# Patient Record
Sex: Male | Born: 1955
Health system: Southern US, Community
[De-identification: ages and names within clinical notes are randomized; demographics above are authoritative.]

## PROBLEM LIST (undated history)

## (undated) DIAGNOSIS — B182 Chronic viral hepatitis C: Secondary | ICD-10-CM

## (undated) DIAGNOSIS — F1910 Other psychoactive substance abuse, uncomplicated: Secondary | ICD-10-CM

## (undated) DIAGNOSIS — E119 Type 2 diabetes mellitus without complications: Secondary | ICD-10-CM

## (undated) DIAGNOSIS — I10 Essential (primary) hypertension: Secondary | ICD-10-CM

## (undated) DIAGNOSIS — F1120 Opioid dependence, uncomplicated: Secondary | ICD-10-CM

## (undated) HISTORY — PX: COLONOSCOPY: SHX174

---

## 2005-02-03 ENCOUNTER — Ambulatory Visit: Payer: Self-pay | Admitting: Family Medicine

## 2005-02-16 ENCOUNTER — Ambulatory Visit: Payer: Self-pay | Admitting: Family Medicine

## 2005-02-18 ENCOUNTER — Ambulatory Visit: Payer: Self-pay | Admitting: *Deleted

## 2005-03-10 ENCOUNTER — Ambulatory Visit: Payer: Self-pay | Admitting: Family Medicine

## 2005-06-07 ENCOUNTER — Ambulatory Visit: Payer: Self-pay | Admitting: Family Medicine

## 2005-06-19 ENCOUNTER — Emergency Department (HOSPITAL_COMMUNITY): Admission: EM | Admit: 2005-06-19 | Discharge: 2005-06-19 | Payer: Self-pay | Admitting: Emergency Medicine

## 2005-06-22 ENCOUNTER — Ambulatory Visit: Payer: Self-pay | Admitting: Family Medicine

## 2005-07-26 ENCOUNTER — Ambulatory Visit: Payer: Self-pay | Admitting: Gastroenterology

## 2005-09-16 ENCOUNTER — Ambulatory Visit (HOSPITAL_COMMUNITY): Admission: RE | Admit: 2005-09-16 | Discharge: 2005-09-16 | Payer: Self-pay | Admitting: Gastroenterology

## 2005-09-16 ENCOUNTER — Encounter (INDEPENDENT_AMBULATORY_CARE_PROVIDER_SITE_OTHER): Payer: Self-pay | Admitting: Specialist

## 2005-11-07 ENCOUNTER — Ambulatory Visit: Payer: Self-pay | Admitting: Family Medicine

## 2006-03-21 ENCOUNTER — Ambulatory Visit: Payer: Self-pay | Admitting: Gastroenterology

## 2006-04-25 ENCOUNTER — Ambulatory Visit: Payer: Self-pay | Admitting: Gastroenterology

## 2006-06-26 ENCOUNTER — Ambulatory Visit: Payer: Self-pay | Admitting: Family Medicine

## 2006-08-30 ENCOUNTER — Ambulatory Visit: Payer: Self-pay | Admitting: Gastroenterology

## 2006-09-06 ENCOUNTER — Ambulatory Visit: Payer: Self-pay | Admitting: Gastroenterology

## 2006-09-25 ENCOUNTER — Ambulatory Visit: Payer: Self-pay | Admitting: Gastroenterology

## 2006-11-08 ENCOUNTER — Encounter (INDEPENDENT_AMBULATORY_CARE_PROVIDER_SITE_OTHER): Payer: Self-pay | Admitting: *Deleted

## 2007-03-15 ENCOUNTER — Ambulatory Visit: Payer: Self-pay | Admitting: Gastroenterology

## 2007-04-20 ENCOUNTER — Ambulatory Visit: Payer: Self-pay | Admitting: Internal Medicine

## 2007-04-20 ENCOUNTER — Encounter (INDEPENDENT_AMBULATORY_CARE_PROVIDER_SITE_OTHER): Payer: Self-pay | Admitting: Family Medicine

## 2007-04-20 LAB — CONVERTED CEMR LAB
ALT: 22 units/L (ref 0–53)
AST: 25 units/L (ref 0–37)
Albumin: 4.1 g/dL (ref 3.5–5.2)
CO2: 21 meq/L (ref 19–32)
Chloride: 104 meq/L (ref 96–112)
Creatinine, Ser: 0.84 mg/dL (ref 0.40–1.50)
Glucose, Bld: 90 mg/dL (ref 70–99)
HDL: 33 mg/dL — ABNORMAL LOW (ref >39)
LDL Cholesterol: 108 mg/dL — ABNORMAL HIGH (ref 0–99)
Lymphs Abs: 2.5 10*3/uL (ref 0.7–4.0)
MCHC: 32.9 g/dL (ref 30.0–36.0)
Monocytes Relative: 10 % (ref 3–12)
Neutro Abs: 2.1 10*3/uL (ref 1.7–7.7)
Neutrophils Relative %: 41 % — ABNORMAL LOW (ref 43–77)
Potassium: 4.3 meq/L (ref 3.5–5.3)
RDW: 13.6 % (ref 11.5–15.5)
Sodium: 137 meq/L (ref 135–145)
Total CHOL/HDL Ratio: 5.6
Total Protein: 8.3 g/dL (ref 6.0–8.3)
Triglycerides: 219 mg/dL — ABNORMAL HIGH (ref <150)
VLDL: 44 mg/dL — ABNORMAL HIGH (ref 0–40)
WBC: 5.3 10*3/uL (ref 4.0–10.5)

## 2007-09-17 ENCOUNTER — Ambulatory Visit: Payer: Self-pay | Admitting: Internal Medicine

## 2008-08-07 ENCOUNTER — Ambulatory Visit: Payer: Self-pay | Admitting: Internal Medicine

## 2010-07-09 NOTE — Consult Note (Signed)
NAME:  Adrian Castillo, Adrian Castillo                 ACCOUNT NO.:  [id number]   MEDICAL RECORD NO.:  [id number]          PATIENT TYPE:  EMS   LOCATION:  [facility]                         FACILITY:  [facility]   PHYSICIAN:  Ollen Gross. Vernell Morgans, M.D. DATE OF BIRTH:  May 02, 1955   DATE OF CONSULTATION:  06/19/2005  DATE OF DISCHARGE:  06/19/2005                                   CONSULTATION   HISTORY:  Mr. Schwinn is a 55 year old black male who has a long history of  hemorrhoids but began having acute pain on Friday.  Pain did not improve  during the weekend.  He has had a little bit of bleeding at his rectum, as  well.  He otherwise denies any fevers or chills, chest pain, shortness of  breath, diarrhea, or dysuria.  The rest of his review of systems,  unremarkable.   PAST MEDICAL HISTORY:  Significant for hepatitis B, hypertension,  hemorrhoids.   PAST SURGICAL HISTORY:  None.   MEDICATIONS:  Sular.   ALLERGIES:  TYLENOL.   SOCIAL HISTORY:  He is a heavy drinker and smokes cigarettes.   FAMILY HISTORY:  Noncontributory.   PHYSICAL EXAMINATION:  VITAL SIGNS:  Temp 97.9, blood pressure 143/103,  pulse 99.  GENERAL:  He is a well-developed, well-nourished black male in no acute  distress.  SKIN:  Warm and dry without jaundice.  HEENT:  Eyes:  Extraocular muscles were intact.  Pupils were equal, round  and reactive to light.  Sclerae nonicteric.  LUNGS:  Clear bilaterally without any use of accessory muscles.  HEART:  Regular rate and rhythm, on plane chest.  ABDOMEN:  Soft, nontender.  No palpable mass or hepatosplenomegaly.  EXTREMITIES:  No clubbing, cyanosis or edema.  Good strength in his arms and  legs.  PSYCH:  He is alert and oriented x 3 with no evidence of anxiety or  depression.  RECTAL:  He has circumferential internal and external hemorrhoids with  prolapse and inflammation.   ASSESSMENT AND PLAN:  This is a 55 year old black male with multiple medical  problems who also has circumferential  inflamed, prolapsing hemorrhoids.  Given the circumferential nature of the hemorrhoids, there is significant  risk of operating on him acutely of causing a rectal stricture.  Because of  this, I think he would be best served by several days of bed rest with local  wound care measures such as ice packs and Tucks pads alternating during the  day and night to try to get these to shrink.  Once they have shrunk, then  will be able to see what  hemorrhoidal tissue is left behind and whether he will need a more formal  hemorrhoidectomy or not.  I have instructed him on this and will plan to see  him back later this week to check on his progress.  He agrees to call sooner  if any problems in the meantime.      Ollen Gross. Vernell Morgans, M.D.  Electronically Signed     PST/MEDQ  D:  06/19/2005  T:  06/20/2005  Job:  [id number]

## 2013-02-04 ENCOUNTER — Encounter (HOSPITAL_COMMUNITY): Payer: Self-pay | Admitting: Emergency Medicine

## 2013-02-04 ENCOUNTER — Emergency Department (HOSPITAL_COMMUNITY)
Admission: EM | Admit: 2013-02-04 | Discharge: 2013-02-04 | Disposition: A | Discharge status: Home / Self Care | Payer: Self-pay | Attending: Emergency Medicine | Admitting: Emergency Medicine

## 2013-02-04 ENCOUNTER — Emergency Department (HOSPITAL_COMMUNITY): Payer: Self-pay

## 2013-02-04 DIAGNOSIS — I10 Essential (primary) hypertension: Secondary | ICD-10-CM | POA: Insufficient documentation

## 2013-02-04 DIAGNOSIS — E1169 Type 2 diabetes mellitus with other specified complication: Secondary | ICD-10-CM | POA: Insufficient documentation

## 2013-02-04 DIAGNOSIS — E876 Hypokalemia: Secondary | ICD-10-CM | POA: Insufficient documentation

## 2013-02-04 DIAGNOSIS — F17200 Nicotine dependence, unspecified, uncomplicated: Secondary | ICD-10-CM | POA: Insufficient documentation

## 2013-02-04 DIAGNOSIS — B182 Chronic viral hepatitis C: Secondary | ICD-10-CM | POA: Insufficient documentation

## 2013-02-04 DIAGNOSIS — Z79899 Other long term (current) drug therapy: Secondary | ICD-10-CM | POA: Insufficient documentation

## 2013-02-04 HISTORY — DX: Essential (primary) hypertension: I10

## 2013-02-04 HISTORY — DX: Type 2 diabetes mellitus without complications: E11.9

## 2013-02-04 HISTORY — DX: Chronic viral hepatitis C: B18.2

## 2013-02-04 LAB — GLUCOSE, CAPILLARY: Glucose-Capillary: 119 mg/dL — ABNORMAL HIGH (ref 70–99)

## 2013-02-04 LAB — CBC
Hemoglobin: 17.6 g/dL — ABNORMAL HIGH (ref 13.0–17.0)
MCHC: 35.6 g/dL (ref 30.0–36.0)
RBC: 5.78 MIL/uL (ref 4.22–5.81)
WBC: 5.3 10*3/uL (ref 4.0–10.5)

## 2013-02-04 LAB — COMPREHENSIVE METABOLIC PANEL
CO2: 25 mEq/L (ref 19–32)
GFR calc non Af Amer: >90 mL/min (ref >90)
Potassium: 3.3 mEq/L — ABNORMAL LOW (ref 3.5–5.1)

## 2013-02-04 LAB — POCT I-STAT TROPONIN I

## 2013-02-04 MED ORDER — POTASSIUM CHLORIDE ER 20 MEQ PO TBCR: 10.0000 meq | EXTENDED_RELEASE_TABLET | Freq: Every day | ORAL | Status: DC

## 2013-02-04 NOTE — ED Notes (Signed)
Patient transported to X-ray 

## 2013-02-04 NOTE — ED Notes (Signed)
Pt c/o low bld sugar readings the past 2 days, along with generalized weakness. sts yesterday he did have some n/v/d, sts that has all resolved now. Denies nausea also. Hx of smoking and HTN. sts he was having some right sided CP earlier, none at this moment. C/o dry cough on and off. Nad, skin warm and dry, resp e/u.

## 2013-02-04 NOTE — ED Notes (Signed)
States that his glucose  Was low x 2 today and yesterday he was really tired states sugar read 50 on jis meter and it is 100 here

## 2013-02-04 NOTE — ED Notes (Signed)
Patient transported back from xray 

## 2013-02-04 NOTE — ED Provider Notes (Signed)
CSN: [id number]     Arrival date & time 02/04/13  1406 History   First MD Initiated Contact with Patient 02/04/13 1828     Chief Complaint  Patient presents with  . Fatigue    HPI Patient presents with 2 episodes of sugars in the 50s yesterday.  Says he's had some feeling of fatigue.  No LOC.  No diaphoresis.  Patient is currently taking Glucotrol and a Dyazide diuretic for diabetes.  Patient has not taken insulin.  Patient's had no episodes of hypoglycemic coma. Past Medical History  Diagnosis Date  . Diabetes mellitus without complication   . Hypertension   . Hep C w/o coma, chronic    History reviewed. No pertinent past surgical history. No family history on file. History  Substance Use Topics  . Smoking status: Current Every Day Smoker  . Smokeless tobacco: Not on file  . Alcohol Use: Yes    Review of Systems All other systems reviewed and are negative Allergies  Review of patient's allergies indicates no known allergies.  Home Medications   Current Outpatient Rx  Name  Route  Sig  Dispense  Refill  . amLODipine (NORVASC) 10 MG tablet   Oral   Take 10 mg by mouth daily.         Marland Kitchen glipiZIDE (GLUCOTROL) 5 MG tablet   Oral   Take 5 mg by mouth daily before breakfast.         . hydrochlorothiazide (HYDRODIURIL) 25 MG tablet   Oral   Take 25 mg by mouth daily.         . hydrocortisone (ANUSOL-HC) 2.5 % rectal cream   Rectal   Place 1 application rectally 2 (two) times daily as needed for hemorrhoids or itching.         . potassium chloride 20 MEQ TBCR   Oral   Take 10 mEq by mouth daily.   15 tablet   0    BP 117/80  Pulse 56  Temp(Src) 98.8 F (37.1 C) (Oral)  Resp 16  Wt 196 lb 4.8 oz (89.041 kg)  SpO2 94% Physical Exam  Nursing note and vitals reviewed. Constitutional: He is oriented to person, place, and time. He appears well-developed and well-nourished. No distress.  HENT:  Head: Normocephalic and atraumatic.  Eyes: Pupils are equal,  round, and reactive to light.  Neck: Normal range of motion.  Cardiovascular: Normal rate and intact distal pulses.   Pulmonary/Chest: No respiratory distress.  Abdominal: Normal appearance. He exhibits no distension.  Musculoskeletal: Normal range of motion.  Neurological: He is alert and oriented to person, place, and time. No cranial nerve deficit.  Skin: Skin is warm and dry. No rash noted.  Psychiatric: He has a normal mood and affect. His behavior is normal.    ED Course  Procedures (including critical care time) Labs Review Labs Reviewed  GLUCOSE, CAPILLARY - Abnormal; Notable for the following:    Glucose-Capillary 119 (*)    All other components within normal limits  CBC - Abnormal; Notable for the following:    Hemoglobin 17.6 (*)    Platelets 138 (*)    All other components within normal limits  COMPREHENSIVE METABOLIC PANEL - Abnormal; Notable for the following:    Potassium 3.3 (*)    Glucose, Bld 118 (*)    Total Protein 8.4 (*)    All other components within normal limits  POCT I-STAT TROPONIN I   Imaging Review No results found.  EKG Interpretation  Date/Time:  Monday February 04 2013 18:39:38 EST Ventricular Rate:  56 PR Interval:  183 QRS Duration: 97 QT Interval:  424 QTC Calculation: 409 R Axis:   29 Text Interpretation:  Confirmed by Phillips Goulette  MD, Leinani Lisbon ([id number]) on 02/04/2013 8:16:00 PM            MDM   1. Hypokalemia        Nelia Shi, MD 02/07/13 [id number]

## 2013-02-04 NOTE — ED Notes (Signed)
CBG is 119. Notified Nurse Arline Asp.

## 2014-07-29 ENCOUNTER — Emergency Department (HOSPITAL_COMMUNITY)
Admission: EM | Admit: 2014-07-29 | Discharge: 2014-07-29 | Disposition: A | Discharge status: Home / Self Care | Payer: No Typology Code available for payment source | Attending: Emergency Medicine | Admitting: Emergency Medicine

## 2014-07-29 ENCOUNTER — Encounter (HOSPITAL_COMMUNITY): Payer: Self-pay | Admitting: Emergency Medicine

## 2014-07-29 DIAGNOSIS — E119 Type 2 diabetes mellitus without complications: Secondary | ICD-10-CM | POA: Insufficient documentation

## 2014-07-29 DIAGNOSIS — Y9389 Activity, other specified: Secondary | ICD-10-CM | POA: Insufficient documentation

## 2014-07-29 DIAGNOSIS — Y9289 Other specified places as the place of occurrence of the external cause: Secondary | ICD-10-CM | POA: Insufficient documentation

## 2014-07-29 DIAGNOSIS — Z720 Tobacco use: Secondary | ICD-10-CM | POA: Insufficient documentation

## 2014-07-29 DIAGNOSIS — T24211A Burn of second degree of right thigh, initial encounter: Secondary | ICD-10-CM | POA: Diagnosis present

## 2014-07-29 DIAGNOSIS — Z79899 Other long term (current) drug therapy: Secondary | ICD-10-CM | POA: Insufficient documentation

## 2014-07-29 DIAGNOSIS — T24231A Burn of second degree of right lower leg, initial encounter: Secondary | ICD-10-CM | POA: Insufficient documentation

## 2014-07-29 DIAGNOSIS — I10 Essential (primary) hypertension: Secondary | ICD-10-CM | POA: Insufficient documentation

## 2014-07-29 DIAGNOSIS — X118XXA Contact with other hot tap-water, initial encounter: Secondary | ICD-10-CM | POA: Insufficient documentation

## 2014-07-29 DIAGNOSIS — T300 Burn of unspecified body region, unspecified degree: Secondary | ICD-10-CM

## 2014-07-29 DIAGNOSIS — Z8619 Personal history of other infectious and parasitic diseases: Secondary | ICD-10-CM | POA: Insufficient documentation

## 2014-07-29 DIAGNOSIS — Y998 Other external cause status: Secondary | ICD-10-CM | POA: Insufficient documentation

## 2014-07-29 HISTORY — DX: Opioid dependence, uncomplicated: F11.20

## 2014-07-29 MED ORDER — OXYCODONE-ACETAMINOPHEN 5-325 MG PO TABS
1.0000 | ORAL_TABLET | Freq: Once | ORAL | Status: AC
  Administered 2014-07-29: 1 via ORAL
  Filled 2014-07-29: qty 1

## 2014-07-29 MED ORDER — HYDROCODONE-ACETAMINOPHEN 5-325 MG PO TABS: 1.0000 | ORAL_TABLET | Freq: Four times a day (QID) | ORAL | Status: DC | PRN

## 2014-07-29 MED ORDER — SILVER SULFADIAZINE 1 % EX CREA
TOPICAL_CREAM | Freq: Once | CUTANEOUS | Status: AC
  Administered 2014-07-29: 1 via TOPICAL
  Filled 2014-07-29: qty 85
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
## 2014-07-29 NOTE — Discharge Instructions (Signed)
·   Thank you for allowing [place] to be involved in your healthcare while you were hospitalized at [facility].   Please note that there have been changes to your home medications.  --> PLEASE LOOK AT YOUR DISCHARGE MEDICATION LIST FOR DETAILS.  Please call your PCP if you have any questions or concerns, or any difficulty getting any of your medications.  Please return to the ER if you have worsening of your symptoms or new severe symptoms arise.   Your burn should health within 7-21 days. Keep the areas clean with mild soap and water. You can apply the silvadene cream and a dressing to the wound. Avoid bursting your other blisters and let them open on their own. If they do open, keep those areas clean as well. You can take vicodin 1 pill every 6 hours for severe pain. Otherwise, take Advil (ibuprofen) for your pain. Please follow up with your primary care physician in 1-2 weeks for a wound check.   Burn Care Your skin is a natural barrier to infection. It is the largest organ of your body. Mykal Kirchman damage this natural protection. To help prevent infection, it is very important to follow your caregiver's instructions in the care of your burn. Kyann Heydt are classified as:  First degree. There is only redness of the skin (erythema). No scarring is expected.  Second degree. There is blistering of the skin. Scarring may occur with deeper Liese Dizdarevic.  Third degree. All layers of the skin are injured, and scarring is expected. HOME CARE INSTRUCTIONS   Wash your hands well before changing your bandage.  Change your bandage as often as directed by your caregiver.  Remove the old bandage. If the bandage sticks, you may soak it off with cool, clean water.  Cleanse the burn thoroughly but gently with mild soap and water.  Pat the area dry with a clean, dry cloth.  Apply a thin layer of antibacterial cream to the burn.  Apply a clean bandage as instructed by your caregiver.  Keep the bandage  as clean and dry as possible.  Elevate the affected area for the first 24 hours, then as instructed by your caregiver.  Only take over-the-counter or prescription medicines for pain, discomfort, or fever as directed by your caregiver. SEEK IMMEDIATE MEDICAL CARE IF:   You develop excessive pain.  You develop redness, tenderness, swelling, or red streaks near the burn.  The burned area develops yellowish-white fluid (pus) or a bad smell.  You have a fever. MAKE SURE YOU:   Understand these instructions.  Will watch your condition.  Will get help right away if you are not doing well or get worse. Document Released: 02/07/2005 Document Revised: 05/02/2011 Document Reviewed: 06/30/2010 [facility] Patient Information 2015 [place], [place]. This information is not intended to replace advice given to you by your health care provider. Make sure you discuss any questions you have with your health care provider.

## 2014-08-01 ENCOUNTER — Encounter (HOSPITAL_COMMUNITY): Payer: Self-pay | Admitting: *Deleted

## 2014-08-01 ENCOUNTER — Emergency Department (HOSPITAL_COMMUNITY)
Admission: EM | Admit: 2014-08-01 | Discharge: 2014-08-01 | Disposition: A | Discharge status: Home / Self Care | Payer: No Typology Code available for payment source | Attending: Emergency Medicine | Admitting: Emergency Medicine

## 2014-08-01 DIAGNOSIS — T24211D Burn of second degree of right thigh, subsequent encounter: Secondary | ICD-10-CM | POA: Insufficient documentation

## 2014-08-01 DIAGNOSIS — Z79899 Other long term (current) drug therapy: Secondary | ICD-10-CM | POA: Insufficient documentation

## 2014-08-01 DIAGNOSIS — I10 Essential (primary) hypertension: Secondary | ICD-10-CM | POA: Diagnosis not present

## 2014-08-01 DIAGNOSIS — T24231D Burn of second degree of right lower leg, subsequent encounter: Secondary | ICD-10-CM | POA: Insufficient documentation

## 2014-08-01 DIAGNOSIS — D849 Immunodeficiency, unspecified: Secondary | ICD-10-CM | POA: Insufficient documentation

## 2014-08-01 DIAGNOSIS — Z720 Tobacco use: Secondary | ICD-10-CM | POA: Insufficient documentation

## 2014-08-01 DIAGNOSIS — X118XXD Contact with other hot tap-water, subsequent encounter: Secondary | ICD-10-CM | POA: Insufficient documentation

## 2014-08-01 DIAGNOSIS — Z8619 Personal history of other infectious and parasitic diseases: Secondary | ICD-10-CM | POA: Insufficient documentation

## 2014-08-01 DIAGNOSIS — T300 Burn of unspecified body region, unspecified degree: Secondary | ICD-10-CM

## 2014-08-01 DIAGNOSIS — T24201D Burn of second degree of unspecified site of right lower limb, except ankle and foot, subsequent encounter: Secondary | ICD-10-CM

## 2014-08-01 DIAGNOSIS — X118XXA Contact with other hot tap-water, initial encounter: Secondary | ICD-10-CM

## 2014-08-01 DIAGNOSIS — E119 Type 2 diabetes mellitus without complications: Secondary | ICD-10-CM | POA: Diagnosis not present

## 2014-08-01 LAB — CBG MONITORING, ED: Glucose-Capillary: 70 mg/dL (ref 65–99)

## 2014-08-01 MED ORDER — CEPHALEXIN 500 MG PO CAPS: ORAL_CAPSULE | ORAL | Status: DC

## 2014-08-01 MED ORDER — HYDROCODONE-ACETAMINOPHEN 5-325 MG PO TABS
1.0000 | ORAL_TABLET | Freq: Once | ORAL | Status: AC
  Administered 2014-08-01: 1 via ORAL
  Filled 2014-08-01: qty 1
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
## 2014-08-01 NOTE — Discharge Instructions (Signed)
Continue taking silvadene cream as directed. Keep wounds covered and clean/dry. Use ice over areas of pain to help with pain. Elevate for additional pain relief. Use your home norco for pain as well as ibuprofen. Take antibiotic as directed. Follow up with the wound care center in 3 days for ongoing care of your wounds. Return to the ER for changes or worsening symptoms.   Burn Care Your skin is a natural barrier to infection. It is the largest organ of your body. Burns damage this natural protection. To help prevent infection, it is very important to follow your caregiver's instructions in the care of your burn. Burns are classified as:  First degree. There is only redness of the skin (erythema). No scarring is expected.  Second degree. There is blistering of the skin. Scarring may occur with deeper burns.  Third degree. All layers of the skin are injured, and scarring is expected. HOME CARE INSTRUCTIONS   Wash your hands well before changing your bandage.  Change your bandage as often as directed by your caregiver.  Remove the old bandage. If the bandage sticks, you may soak it off with cool, clean water.  Cleanse the burn thoroughly but gently with mild soap and water.  Pat the area dry with a clean, dry cloth.  Apply a thin layer of antibacterial cream to the burn.  Apply a clean bandage as instructed by your caregiver.  Keep the bandage as clean and dry as possible.  Elevate the affected area for the first 24 hours, then as instructed by your caregiver.  Only take over-the-counter or prescription medicines for pain, discomfort, or fever as directed by your caregiver. SEEK IMMEDIATE MEDICAL CARE IF:   You develop excessive pain.  You develop redness, tenderness, swelling, or red streaks near the burn.  The burned area develops yellowish-white fluid (pus) or a bad smell.  You have a fever. MAKE SURE YOU:   Understand these instructions.  Will watch your  condition.  Will get help right away if you are not doing well or get worse. Document Released: 02/07/2005 Document Revised: 05/02/2011 Document Reviewed: 06/30/2010 [facility] Patient Information 2015 [place], [place]. This information is not intended to replace advice given to you by your health care provider. Make sure you discuss any questions you have with your health care provider.  Second-Degree Burn A second-degree burn affects the 2 outer layers of skin. The outer layer (epidermis) and the layer underneath it (dermis) are both burned. Another name for this type of burn is a partial thickness burn. A second-degree burn may be called minor or major. This depends on the size of the burn. It also depends on what parts of the skin are burned. Minor burns may be treated with first aid. Major burns are a medical emergency. A second-degree burn is worse than a first-degree burn, but not as bad as a third-degree burn. A first-degree burn affects only the epidermis. A third-degree burn goes through all the layers of skin. A second-degree burn usually heals in 3 to 4 weeks. A minor second-degree burn usually does not leave a scar.Deeper second-degree burns may lead to scarring of the skin or contractures over joints.Contractures are scars that form over joints and may lead to reduced mobility at those joints. CAUSES  Heat (thermal) injury. This happens when skin comes in contact with something very hot. It could be a flame, a hot object, hot liquid, or steam. Most second-degree burns are thermal injuries.  Radiation. Sunlight is one type  of radiation that can burn the skin. Another type of radiation is used to heat food. Radiation is also used to treat some diseases, such as cancer. All types of radiation can burn the skin. Sunlight usually causes a first-degree burn. Radiation used for heating food or treating a disease can cause a second-degree burn.  Electricity. Electrical burns can cause more damage  under the skin than on the surface. They should always be treated as major burns.  Chemicals. Many chemicals can burn the skin. The burn should be flushed with cool water and checked by an emergency caregiver. SYMPTOMS Symptoms of second-degree burns include:  Severe pain.  Extreme tenderness.  Deep redness.  Blistered skin.  Skin that has changed color.It might look blotchy, wet, or shiny.  Swelling. TREATMENT Some second-degree burns may need to be treated in a hospital. These include major burns, electrical burns, and chemical burns. Many other second-degree burns can be treated with regular first aid, such as:  Cooling the burn. Use cool, germ-free (sterile) salt water. Place the burned area of skin into a tub of water, or cover the burned area with clean, wet towels.  Taking pain medicine.  Removing the dead skin from broken blisters. A trained caregiver may do this. Do not pop blisters.  Gently washing your skin with mild soap.  Covering the burned area with a cream.Silver sulfadiazine is a cream for burns. An antibiotic cream, such as bacitracin, may also be used to fight infection. Do not use other ointments or creams unless your caregiver says it is okay.  Protecting the burn with a sterile, non-sticky bandage.  Bandaging fingers and toes separately. This keeps them from sticking together.  Taking an antibiotic. This can help prevent infection.  Getting a tetanus shot. HOME CARE INSTRUCTIONS Medication  Take any medicine prescribed by your caregiver. Follow the directions carefully.  Ask your caregiver if you can take over-the-counter medicine to relieve pain and swelling. Do not give aspirin to children.  Make sure your caregiver knows about all other medicines you take.This includes over-the-counter medicines. Burn care  You will need to change the bandage on your burn. You may need to do this 2 or 3 times each day.  Gently clean the burned area.  Put  ointment on it.  Cover the burn with a sterile bandage.  For some deeper burns or burns that cover a large area, compression garments may be prescribed. These garments can help minimize scarring and protect your mobility.  Do not put butter or oil on your skin. Use only the cream prescribed by your caregiver.  Do not put ice on your burn.  Do not break blisters on your skin.  Keep the bandaged area dry. You might need to take a sponge bath for awhile.Ask your caregiver when you can take a shower or a tub bath again.  Do not scratch an itchy burn. Your caregiver may give you medicine to relieve very bad itching.  Infection is a big danger after a second-degree burn. Tell your caregiver right away if you have signs of infection, such as:  Redness or changing color in the burned area.  Fluid leaking from the burn.  Swelling in the burn area.  A bad smell coming from the wound. Follow-up  Keep all follow-up appointments.This is important. This is how your caregiver can tell if your treatment is working.  Protect your burn from sunlight.Use sunscreen whenever you go outside.Burned areas may be sensitive to the sun for up  to 1 year. Exposure to the sun may also cause permanent darkening of scars. SEEK MEDICAL CARE IF:  You have any questions about medicines.  You have any questions about your treatment.  You wonder if it is okay to do a particular activity.  You develop a fever of more than 100.5 F (38.1 C). SEEK IMMEDIATE MEDICAL CARE IF:  You think your burn might be infected. It may change color, become red, leak fluid, swell, or smell bad.  You develop a fever of more than 102 F (38.9 C). Document Released: 07/12/2010 Document Revised: 05/02/2011 Document Reviewed: 07/12/2010 [facility] Patient Information 2015 [place], [place]. This information is not intended to replace advice given to you by your health care provider. Make sure you discuss any questions you have  with your health care provider.
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
## 2014-08-04 ENCOUNTER — Encounter (HOSPITAL_COMMUNITY): Payer: Self-pay | Admitting: Emergency Medicine

## 2014-08-04 ENCOUNTER — Emergency Department (HOSPITAL_COMMUNITY)
Admission: EM | Admit: 2014-08-04 | Discharge: 2014-08-04 | Disposition: A | Discharge status: Home / Self Care | Payer: No Typology Code available for payment source | Attending: Emergency Medicine | Admitting: Emergency Medicine

## 2014-08-04 DIAGNOSIS — Z4800 Encounter for change or removal of nonsurgical wound dressing: Secondary | ICD-10-CM | POA: Insufficient documentation

## 2014-08-04 DIAGNOSIS — Z8619 Personal history of other infectious and parasitic diseases: Secondary | ICD-10-CM | POA: Insufficient documentation

## 2014-08-04 DIAGNOSIS — Z79899 Other long term (current) drug therapy: Secondary | ICD-10-CM | POA: Insufficient documentation

## 2014-08-04 DIAGNOSIS — I10 Essential (primary) hypertension: Secondary | ICD-10-CM | POA: Insufficient documentation

## 2014-08-04 DIAGNOSIS — Z8659 Personal history of other mental and behavioral disorders: Secondary | ICD-10-CM | POA: Insufficient documentation

## 2014-08-04 DIAGNOSIS — Z720 Tobacco use: Secondary | ICD-10-CM | POA: Diagnosis not present

## 2014-08-04 DIAGNOSIS — E119 Type 2 diabetes mellitus without complications: Secondary | ICD-10-CM | POA: Insufficient documentation

## 2014-08-04 DIAGNOSIS — Z09 Encounter for follow-up examination after completed treatment for conditions other than malignant neoplasm: Secondary | ICD-10-CM
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
## 2014-09-08 ENCOUNTER — Encounter (HOSPITAL_BASED_OUTPATIENT_CLINIC_OR_DEPARTMENT_OTHER): Payer: No Typology Code available for payment source | Attending: Plastic Surgery

## 2014-11-22 ENCOUNTER — Emergency Department (HOSPITAL_COMMUNITY): Payer: No Typology Code available for payment source

## 2014-11-22 ENCOUNTER — Emergency Department (HOSPITAL_COMMUNITY)
Admission: EM | Admit: 2014-11-22 | Discharge: 2014-11-22 | Disposition: A | Discharge status: Home / Self Care | Payer: No Typology Code available for payment source | Attending: Emergency Medicine | Admitting: Emergency Medicine

## 2014-11-22 ENCOUNTER — Encounter (HOSPITAL_COMMUNITY): Payer: Self-pay | Admitting: Emergency Medicine

## 2014-11-22 DIAGNOSIS — Z79899 Other long term (current) drug therapy: Secondary | ICD-10-CM | POA: Insufficient documentation

## 2014-11-22 DIAGNOSIS — Y9389 Activity, other specified: Secondary | ICD-10-CM | POA: Diagnosis not present

## 2014-11-22 DIAGNOSIS — I10 Essential (primary) hypertension: Secondary | ICD-10-CM | POA: Insufficient documentation

## 2014-11-22 DIAGNOSIS — E119 Type 2 diabetes mellitus without complications: Secondary | ICD-10-CM | POA: Diagnosis not present

## 2014-11-22 DIAGNOSIS — Y990 Civilian activity done for income or pay: Secondary | ICD-10-CM | POA: Insufficient documentation

## 2014-11-22 DIAGNOSIS — Z720 Tobacco use: Secondary | ICD-10-CM | POA: Diagnosis not present

## 2014-11-22 DIAGNOSIS — S29001A Unspecified injury of muscle and tendon of front wall of thorax, initial encounter: Secondary | ICD-10-CM | POA: Insufficient documentation

## 2014-11-22 DIAGNOSIS — Z8619 Personal history of other infectious and parasitic diseases: Secondary | ICD-10-CM | POA: Insufficient documentation

## 2014-11-22 DIAGNOSIS — Y9289 Other specified places as the place of occurrence of the external cause: Secondary | ICD-10-CM | POA: Insufficient documentation

## 2014-11-22 DIAGNOSIS — W228XXA Striking against or struck by other objects, initial encounter: Secondary | ICD-10-CM | POA: Insufficient documentation

## 2014-11-22 DIAGNOSIS — R0781 Pleurodynia: Secondary | ICD-10-CM

## 2014-11-22 MED ORDER — IBUPROFEN 800 MG PO TABS: 800.0000 mg | ORAL_TABLET | Freq: Three times a day (TID) | ORAL | Status: DC

## 2014-11-22 MED ORDER — OXYCODONE HCL 5 MG PO TABS
5.0000 mg | ORAL_TABLET | Freq: Once | ORAL | Status: AC
  Administered 2014-11-22: 5 mg via ORAL
  Filled 2014-11-22: qty 1

## 2014-11-22 MED ORDER — OXYCODONE HCL 5 MG PO TABS: 5.0000 mg | ORAL_TABLET | ORAL | Status: DC | PRN

## 2014-11-22 MED ORDER — IBUPROFEN 800 MG PO TABS
800.0000 mg | ORAL_TABLET | Freq: Once | ORAL | Status: AC
Start: 2014-11-22 — End: 2014-11-22
  Administered 2014-11-22: 800 mg via ORAL
  Filled 2014-11-22: qty 1

## 2014-11-22 NOTE — ED Notes (Signed)
Patient transported to X-ray 
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
## 2014-11-22 NOTE — Discharge Instructions (Signed)
You came to the ED today for a chest injury. Your imaging was clear from any evidence of serious internal damage, such as rib fractures or a collapsed lung. You should take a day or two off work to rest and recover. Use the oxycodone for severe pain and ibuprofen with it to control inflammation. You may continue to take ibuprofen as needed for pain after the oxycodone is gone.  Followup with PCP as needed.  Return to ED should symptoms worsen.
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
## 2014-11-22 NOTE — ED Notes (Signed)
Patient returned from X-ray 

## 2015-06-12 ENCOUNTER — Emergency Department (HOSPITAL_COMMUNITY)
Admission: EM | Admit: 2015-06-12 | Discharge: 2015-06-12 | Disposition: A | Discharge status: Home / Self Care | Payer: BLUE CROSS/BLUE SHIELD | Attending: Emergency Medicine | Admitting: Emergency Medicine

## 2015-06-12 ENCOUNTER — Encounter (HOSPITAL_COMMUNITY): Payer: Self-pay | Admitting: Emergency Medicine

## 2015-06-12 DIAGNOSIS — Z8619 Personal history of other infectious and parasitic diseases: Secondary | ICD-10-CM | POA: Insufficient documentation

## 2015-06-12 DIAGNOSIS — S61451A Open bite of right hand, initial encounter: Secondary | ICD-10-CM | POA: Insufficient documentation

## 2015-06-12 DIAGNOSIS — F17210 Nicotine dependence, cigarettes, uncomplicated: Secondary | ICD-10-CM | POA: Insufficient documentation

## 2015-06-12 DIAGNOSIS — Z79899 Other long term (current) drug therapy: Secondary | ICD-10-CM | POA: Diagnosis not present

## 2015-06-12 DIAGNOSIS — Z791 Long term (current) use of non-steroidal anti-inflammatories (NSAID): Secondary | ICD-10-CM | POA: Diagnosis not present

## 2015-06-12 DIAGNOSIS — Z23 Encounter for immunization: Secondary | ICD-10-CM | POA: Insufficient documentation

## 2015-06-12 DIAGNOSIS — I10 Essential (primary) hypertension: Secondary | ICD-10-CM | POA: Diagnosis not present

## 2015-06-12 DIAGNOSIS — E119 Type 2 diabetes mellitus without complications: Secondary | ICD-10-CM | POA: Insufficient documentation

## 2015-06-12 DIAGNOSIS — Y998 Other external cause status: Secondary | ICD-10-CM | POA: Insufficient documentation

## 2015-06-12 DIAGNOSIS — Y92410 Unspecified street and highway as the place of occurrence of the external cause: Secondary | ICD-10-CM | POA: Diagnosis not present

## 2015-06-12 DIAGNOSIS — Z7984 Long term (current) use of oral hypoglycemic drugs: Secondary | ICD-10-CM | POA: Insufficient documentation

## 2015-06-12 DIAGNOSIS — Z203 Contact with and (suspected) exposure to rabies: Secondary | ICD-10-CM | POA: Diagnosis not present

## 2015-06-12 DIAGNOSIS — Y9389 Activity, other specified: Secondary | ICD-10-CM | POA: Insufficient documentation

## 2015-06-12 DIAGNOSIS — W540XXA Bitten by dog, initial encounter: Secondary | ICD-10-CM | POA: Insufficient documentation

## 2015-06-12 MED ORDER — RABIES VIRUS VACCINE, HDC IM INJ
1.0000 mL | INJECTION | Freq: Once | INTRAMUSCULAR | Status: AC
  Administered 2015-06-12: 1 mL via INTRAMUSCULAR
  Filled 2015-06-12: qty 1

## 2015-06-12 MED ORDER — AMOXICILLIN-POT CLAVULANATE 875-125 MG PO TABS: 1.0000 | ORAL_TABLET | Freq: Two times a day (BID) | ORAL | Status: DC

## 2015-06-12 MED ORDER — RABIES IMMUNE GLOBULIN 150 UNIT/ML IM INJ
20.0000 [IU]/kg | INJECTION | Freq: Once | INTRAMUSCULAR | Status: AC
  Administered 2015-06-12: 1650 [IU] via INTRAMUSCULAR
  Filled 2015-06-12: qty 12

## 2015-06-12 MED ORDER — TETANUS-DIPHTH-ACELL PERTUSSIS 5-2.5-18.5 LF-MCG/0.5 IM SUSP
0.5000 mL | Freq: Once | INTRAMUSCULAR | Status: AC
  Administered 2015-06-12: 0.5 mL via INTRAMUSCULAR
  Filled 2015-06-12: qty 0.5

## 2015-06-12 NOTE — ED Notes (Signed)
EMT at bedside performing wound care 

## 2015-06-12 NOTE — ED Notes (Signed)
Patient able to ambulate independently  

## 2015-06-12 NOTE — Discharge Instructions (Signed)
Please read and follow all provided instructions.  Your diagnoses today include:  1. Dog bite   2. Contact with and suspected exposure to rabies    Tests performed today include:  Vital signs. See below for your results today.   Medications prescribed:   Take as prescribed  Home care instructions:  Follow any educational materials contained in this packet.  Follow-up instructions: Please follow-up in 3 days for next rabies series, then on day 7, day 14, and day 28. You can got to the emergency department, Urgent care, or Primary care provider.   I have also provided a number for a hand surgeon as needed if symptoms do not improve. Please return to the emergency department if you develop a fever, have significant swelling of your hand with inability to move  Return instructions:   Please return to the Emergency Department if you do not get better, if you get worse, or new symptoms OR  - Fever (temperature greater than 101.70F)  - Bleeding that does not stop with holding pressure to the area    -Severe pain (please note that you may be more sore the day after your accident)  - Chest Pain  - Difficulty breathing  - Severe nausea or vomiting  - Inability to tolerate food and liquids  - Passing out  - Skin becoming red around your wounds  - Change in mental status (confusion or lethargy)  - New numbness or weakness     Please return if you have any other emergent concerns.  Additional Information:  Your vital signs today were: BP 156/95 mmHg   Pulse 82   Temp(Src) 98.5 F (36.9 C) (Oral)   Wt 84.006 kg   SpO2 96% If your blood pressure (BP) was elevated above 135/85 this visit, please have this repeated by your doctor within one month. ---------------

## 2015-06-12 NOTE — ED Notes (Signed)
Pt presents to ED for assessment of an dog bite to his right hand.  Worst laceration/puncture is to the palm of the right hand, and some other marks to the wrist.  Pt does not know the dog, nor does he know how to find it/owner.   Bleeding is controlled.

## 2015-06-12 NOTE — ED Provider Notes (Signed)
CSN: [id number]     Arrival date & time 06/12/15  1730 History  By signing my name below, I, Adrian Castillo, attest that this documentation has been prepared under the direction and in the presence of Shary Decamp, PA-C. Electronically Signed: Doran Castillo, ED Scribe. 06/12/2015. 6:03 PM.   Chief Complaint  Patient presents with  . Animal Bite   The history is provided by the patient. No language interpreter was used.   HPI Comments: Adrian Castillo is a 60 y.o. male who presents to the Emergency Department with a PMHx of DM, HTN, and Hep C complaining of a wound with controlled bleeding from a dog bite one hour ago. Pain 5/10. Dull ache. Pt states an unknown dog on the street with an unknown vaccination history came up to him and bit the palmar aspect of his right hand. He denies any decreased ROM. Pt denies any fevers, diaphoresis, N/V/D or any other symptoms at this time. Pt is allergic to Tylenol. Has not had a rabies vaccine before.   Past Medical History  Diagnosis Date  . Diabetes mellitus without complication ([name])   . Hypertension   . Hep C w/o coma, chronic ([name])   . Heroin addiction ([name])    History reviewed. No pertinent past surgical history. History reviewed. No pertinent family history. Social History  Substance Use Topics  . Smoking status: Current Every Day Smoker -- 1.00 packs/day    Types: Cigarettes  . Smokeless tobacco: None  . Alcohol Use: No    Review of Systems A complete 10 system review of systems was obtained and all systems are negative except as noted in the HPI and PMH.   Allergies  Tylenol  Home Medications   Prior to Admission medications   Medication Sig Start Date End Date Taking? Authorizing Provider  amLODipine (NORVASC) 10 MG tablet Take 10 mg by mouth daily.    Historical Provider, MD  cephALEXin (KEFLEX) 500 MG capsule 2 caps po bid x 7 days 08/01/14   Mercedes Camprubi-Soms, PA-C  glipiZIDE (GLUCOTROL) 5 MG tablet Take 5 mg by mouth daily  before breakfast.    Historical Provider, MD  hydrochlorothiazide (HYDRODIURIL) 25 MG tablet Take 25 mg by mouth daily.    Historical Provider, MD  HYDROcodone-acetaminophen (NORCO/VICODIN) 5-325 MG per tablet Take 1 tablet by mouth every 6 (six) hours as needed for moderate pain. 07/29/14   [name], MD  ibuprofen (ADVIL,MOTRIN) 800 MG tablet Take 1 tablet (800 mg total) by mouth 3 (three) times daily. 11/22/14   [name], PA-C  OVER THE COUNTER MEDICATION Take 1 tablet by mouth daily. Mega Red for prostate health    Historical Provider, MD  oxyCODONE (ROXICODONE) 5 MG immediate release tablet Take 1 tablet (5 mg total) by mouth every 4 (four) hours as needed for severe pain. 11/22/14   [name], PA-C   BP 156/95 mmHg  Pulse 82  Temp(Src) 98.5 F (36.9 C) (Oral)  SpO2 96%   Physical Exam  Constitutional: He is oriented to person, place, and time. He appears well-developed and well-nourished.  HENT:  Head: Normocephalic and atraumatic.  Eyes: EOM are normal.  Neck: Normal range of motion.  Cardiovascular: Normal rate.   Pulmonary/Chest: Effort normal.  Abdominal: He exhibits no distension.  Musculoskeletal: Normal range of motion.  Bite marks noted on palmar and dorsal aspect of right hand. 5 puncture wounds visualized. Superficial. Bleeding controlled. Full ROM of hand. No swelling noted. Distal pulses intact. Cap  refill <2 sec.   Neurological: He is alert and oriented to person, place, and time.  Skin: Skin is warm and dry.  Psychiatric: He has a normal mood and affect.  Nursing note and vitals reviewed.  ED Course  Procedures  DIAGNOSTIC STUDIES: Oxygen Saturation is 96% on room air, normal by my interpretation.    COORDINATION OF CARE: 5:59 PM Will give Rabies vaccination and abx. Discussed treatment plan with pt at bedside and pt agreed to plan.  Labs Review Labs Reviewed - No data to display  Imaging Review No results found. I have personally reviewed and  evaluated these images and lab results as part of my medical decision-making.   EKG Interpretation None      MDM   I have reviewed the relevant previous healthcare records. I obtained HPI from historian.  ED Course:  Assessment: Pt is a 59yM who presents with dog bite on right hand. No rabies vaccine. On exam, pt in NAD. Nontoxic/nonseptic appearing. VSS. Afebrile. Puncture wounds superficial. Full ROM of hand without difficulty. No swelling noted. Given rabies vaccine in ED as well as rabies immune globulin. Tetanus given. Given Rx Augmentin. Plan is to DC with follow up to ED/Urgent Care for repeat series in 3 days. At time of discharge, Patient is in no acute distress. Vital Signs are stable. Patient is able to ambulate. Patient able to tolerate PO.   Disposition/Plan:  DC Home Additional Verbal discharge instructions given and discussed with patient.  Pt Instructed to f/u with Urgent Care for next Rabies series.  Return precautions given Pt acknowledges and agrees with plan  Supervising Physician Julianne Rice, MD   Final diagnoses:  Dog bite  Contact with and suspected exposure to rabies    I personally performed the services described in this documentation, which was scribed in my presence. The recorded information has been reviewed and is accurate.    Shary Decamp, PA-C 06/12/15 1823  Julianne Rice, MD 06/13/15 [phone]

## 2015-06-15 ENCOUNTER — Encounter (HOSPITAL_COMMUNITY): Payer: Self-pay

## 2015-06-15 ENCOUNTER — Emergency Department (HOSPITAL_COMMUNITY)
Admission: EM | Admit: 2015-06-15 | Discharge: 2015-06-15 | Disposition: A | Discharge status: Home / Self Care | Payer: BLUE CROSS/BLUE SHIELD | Attending: Emergency Medicine | Admitting: Emergency Medicine

## 2015-06-15 DIAGNOSIS — Z203 Contact with and (suspected) exposure to rabies: Secondary | ICD-10-CM | POA: Diagnosis present

## 2015-06-15 DIAGNOSIS — F17210 Nicotine dependence, cigarettes, uncomplicated: Secondary | ICD-10-CM | POA: Diagnosis not present

## 2015-06-15 DIAGNOSIS — Z79899 Other long term (current) drug therapy: Secondary | ICD-10-CM | POA: Diagnosis not present

## 2015-06-15 DIAGNOSIS — Z23 Encounter for immunization: Secondary | ICD-10-CM | POA: Insufficient documentation

## 2015-06-15 DIAGNOSIS — Z7984 Long term (current) use of oral hypoglycemic drugs: Secondary | ICD-10-CM | POA: Insufficient documentation

## 2015-06-15 DIAGNOSIS — I10 Essential (primary) hypertension: Secondary | ICD-10-CM | POA: Diagnosis not present

## 2015-06-15 DIAGNOSIS — Z792 Long term (current) use of antibiotics: Secondary | ICD-10-CM | POA: Insufficient documentation

## 2015-06-15 DIAGNOSIS — IMO0001 Reserved for inherently not codable concepts without codable children: Secondary | ICD-10-CM

## 2015-06-15 DIAGNOSIS — E119 Type 2 diabetes mellitus without complications: Secondary | ICD-10-CM | POA: Insufficient documentation

## 2015-06-15 DIAGNOSIS — Z8619 Personal history of other infectious and parasitic diseases: Secondary | ICD-10-CM | POA: Insufficient documentation

## 2015-06-15 DIAGNOSIS — R030 Elevated blood-pressure reading, without diagnosis of hypertension: Secondary | ICD-10-CM

## 2015-06-15 MED ORDER — AMLODIPINE BESYLATE 5 MG PO TABS
10.0000 mg | ORAL_TABLET | Freq: Once | ORAL | Status: AC
  Administered 2015-06-15: 10 mg via ORAL
  Filled 2015-06-15: qty 2

## 2015-06-15 MED ORDER — AMLODIPINE BESYLATE 10 MG PO TABS
10.0000 mg | ORAL_TABLET | Freq: Every day | ORAL | Status: DC
Start: 2015-06-15 — End: 2020-03-31

## 2015-06-15 MED ORDER — HYDROCHLOROTHIAZIDE 25 MG PO TABS: 25.0000 mg | ORAL_TABLET | Freq: Every day | ORAL | Status: DC

## 2015-06-15 MED ORDER — RABIES VIRUS VACCINE, HDC IM INJ
1.0000 mL | INJECTION | Freq: Once | INTRAMUSCULAR | Status: AC
  Administered 2015-06-15: 1 mL via INTRAMUSCULAR
  Filled 2015-06-15: qty 1

## 2015-06-15 MED ORDER — HYDROCHLOROTHIAZIDE 25 MG PO TABS
25.0000 mg | ORAL_TABLET | Freq: Once | ORAL | Status: AC
  Administered 2015-06-15: 25 mg via ORAL
  Filled 2015-06-15: qty 1

## 2015-06-15 NOTE — ED Notes (Signed)
PT needs refill on BP meds.

## 2015-06-15 NOTE — ED Notes (Signed)
Patient not taking BP medicine as prescribed

## 2015-06-15 NOTE — Discharge Instructions (Signed)
Refer to the papers given to you in regards to your follow-up appointment with the urgent care for your next rabies vaccine. I have given you a refill for your blood pressure medicine, however he needs to see a primary physician or the nurse practitioner at the [facility] to continue writing these medications. It is very important that you take these every day as directed.

## 2015-06-15 NOTE — ED Provider Notes (Signed)
CSN: [id number]     Arrival date & time 06/15/15  1000 History  By signing my name below, I, Essence Howell, attest that this documentation has been prepared under the direction and in the presence of Pearlie Oyster, PA-C Electronically Signed: Ladene Artist, ED Scribe 06/15/2015 at 11:20 AM.   Chief Complaint  Patient presents with  . rabies vaccine, day 3    The history is provided by the patient. No language interpreter was used.   HPI Comments: Adrian Castillo is a 60 y.o. male, with a h/o DM, HTN, Hep C, who presents to the Emergency Department for his day 3 rabies vaccine. Pt was bitten by an unknown dog with an unknown vaccination history on the right hand 3 days ago.   Pt also states that he has not taken his BP medications in approximately 3 months due to finances. Pt is prescribed Norvasc and HCTZ by the NP at [facility]. Triage BP: 152/110. He denies HA, visual disturbances, abdominal pain, chest pain and any other symptoms at this time.   Past Medical History  Diagnosis Date  . Diabetes mellitus without complication ([place])   . Hypertension   . Hep C w/o coma, chronic ([place])   . Heroin addiction ([place])    History reviewed. No pertinent past surgical history. No family history on file. Social History  Substance Use Topics  . Smoking status: Current Every Day Smoker -- 1.00 packs/day    Types: Cigarettes  . Smokeless tobacco: None  . Alcohol Use: No    Review of Systems  Eyes: Negative for visual disturbance.  Cardiovascular: Negative for chest pain.  Gastrointestinal: Negative for abdominal pain.  Neurological: Negative for headaches.   Allergies  Tylenol  Home Medications   Prior to Admission medications   Medication Sig Start Date End Date Taking? Authorizing Provider  amLODipine (NORVASC) 10 MG tablet Take 1 tablet (10 mg total) by mouth daily. 06/15/15   Ozella Almond Leslieanne Cobarrubias, PA-C  amoxicillin-clavulanate (AUGMENTIN) 875-125 MG tablet Take 1 tablet by mouth 2 (two) times daily.  06/12/15   Shary Decamp, PA-C  cephALEXin (KEFLEX) 500 MG capsule 2 caps po bid x 7 days 08/01/14   Mercedes Camprubi-Soms, PA-C  glipiZIDE (GLUCOTROL) 5 MG tablet Take 5 mg by mouth daily before breakfast.    Historical Provider, MD  hydrochlorothiazide (HYDRODIURIL) 25 MG tablet Take 1 tablet (25 mg total) by mouth daily. 06/15/15   Ozella Almond Morgana Rowley, PA-C  HYDROcodone-acetaminophen (NORCO/VICODIN) 5-325 MG per tablet Take 1 tablet by mouth every 6 (six) hours as needed for moderate pain. 07/29/14   [name], MD  ibuprofen (ADVIL,MOTRIN) 800 MG tablet Take 1 tablet (800 mg total) by mouth 3 (three) times daily. 11/22/14   [name], PA-C  OVER THE COUNTER MEDICATION Take 1 tablet by mouth daily. Mega Red for prostate health    Historical Provider, MD  oxyCODONE (ROXICODONE) 5 MG immediate release tablet Take 1 tablet (5 mg total) by mouth every 4 (four) hours as needed for severe pain. 11/22/14   [name], PA-C   BP 152/110 mmHg  Pulse 70  Temp(Src) 97.5 F (36.4 C) (Oral)  Resp 19  Ht 5\' 11"  (1.803 m)  Wt 180 lb (81.647 kg)  BMI 25.12 kg/m2  SpO2 99% Physical Exam  Constitutional: He is oriented to person, place, and time. He appears well-developed and well-nourished. No distress.  HENT:  Head: Normocephalic and atraumatic.  Eyes: Conjunctivae and EOM are normal.  Neck: Neck supple.  No tracheal deviation present.  Cardiovascular: Normal rate, regular rhythm and normal heart sounds.  Exam reveals no gallop and no friction rub.   No murmur heard. Pulmonary/Chest: Effort normal and breath sounds normal. No respiratory distress. He has no wheezes. He has no rales.  Abdominal: Soft. He exhibits no distension. There is no tenderness.  Musculoskeletal: Normal range of motion.  Neurological: He is alert and oriented to person, place, and time.  Skin: Skin is warm and dry.  Well-healing puncture wounds over the right dorsal aspect of the hand. No erythema, swelling, or warmth to the touch  appreciated.  Psychiatric: He has a normal mood and affect. His behavior is normal.  Nursing note and vitals reviewed.  ED Course  Procedures (including critical care time) DIAGNOSTIC STUDIES: Oxygen Saturation is 99% on RA, normal by my interpretation.    COORDINATION OF CARE: 11:05 AM-Discussed treatment plan which includes Imovax injection, Norvasc and Hydrodiuril with pt at bedside and pt agreed to plan.   Labs Review Labs Reviewed - No data to display  Imaging Review No results found.   EKG Interpretation None      MDM   Final diagnoses:  Need for rabies vaccination  Elevated blood pressure   Adrian Castillo presents to the emergency department for his second dose of rabies vaccine. He was seen on April 21 and started on Augmentin as well. Hand puncture wounds do not appear infected, however prescription for Augmentin is in the room with his paperwork and has not been filled. Rabies vaccine was given in ED today, nurse provided information regarding urgent care follow-up for remaining doses along with a scheduled for him which was included in his discharge papers.  Patient is noted to have elevated blood pressure 152/110. He has been out of his blood pressure medication for 3 months. He is on Norvasc and HCTZ-his home blood pressure medications were given today and a lengthy discussion was had about the importance of getting medications filled, taking them regularly, and following up routinely with his primary care provider. He is asymptomatic at this time. Return precautions were given and all questions were answered.  I personally performed the services described in this documentation, which was scribed in my presence. The recorded information has been reviewed and is accurate.    [facility] Lorene Klimas, PA-C 06/15/15 1128  Charlesetta Shanks, MD 06/15/15 [phone]

## 2015-06-15 NOTE — ED Notes (Signed)
Declined W/C at D/C and was escorted to lobby by RN. 

## 2015-06-15 NOTE — ED Notes (Signed)
Patient here for day 3 rabies vaccine. Did not receive instructions for f/u at [facility]

## 2015-06-19 ENCOUNTER — Encounter (HOSPITAL_COMMUNITY): Payer: Self-pay | Admitting: Emergency Medicine

## 2015-06-19 ENCOUNTER — Ambulatory Visit (HOSPITAL_COMMUNITY)
Admission: EM | Admit: 2015-06-19 | Discharge: 2015-06-19 | Disposition: A | Discharge status: Home / Self Care | Payer: BLUE CROSS/BLUE SHIELD | Attending: Internal Medicine | Admitting: Internal Medicine

## 2015-06-19 DIAGNOSIS — M13132 Monoarthritis, not elsewhere classified, left wrist: Secondary | ICD-10-CM

## 2015-06-19 DIAGNOSIS — Z23 Encounter for immunization: Secondary | ICD-10-CM

## 2015-06-19 DIAGNOSIS — D170 Benign lipomatous neoplasm of skin and subcutaneous tissue of head, face and neck: Secondary | ICD-10-CM

## 2015-06-19 MED ORDER — INDOMETHACIN 25 MG PO CAPS: 50.0000 mg | ORAL_CAPSULE | Freq: Three times a day (TID) | ORAL | Status: AC

## 2015-06-19 MED ORDER — KETOROLAC TROMETHAMINE 60 MG/2ML IM SOLN
60.0000 mg | Freq: Once | INTRAMUSCULAR | Status: AC
  Administered 2015-06-19: 60 mg via INTRAMUSCULAR

## 2015-06-19 MED ORDER — KETOROLAC TROMETHAMINE 60 MG/2ML IM SOLN
INTRAMUSCULAR | Status: AC
  Filled 2015-06-19: qty 2

## 2015-06-19 MED ORDER — RABIES VIRUS VACCINE, HDC IM INJ
1.0000 mL | INJECTION | Freq: Once | INTRAMUSCULAR | Status: AC
  Administered 2015-06-19: 1 mL via INTRAMUSCULAR

## 2015-06-19 MED ORDER — RABIES VIRUS VACCINE, HDC IM INJ
INJECTION | INTRAMUSCULAR | Status: AC
  Filled 2015-06-19: qty 1

## 2015-06-19 NOTE — ED Notes (Signed)
Patient is here for day 7 in rabies series.  Meanwhile patient has a red, swollen left wrist.  No history of this before.  Patient denies an injury.  Patient has not filled any scripts that were received in either ed visit.  Patient has received Imovax in both previous visits to ed.

## 2015-06-19 NOTE — Discharge Instructions (Signed)
Lipoma A lipoma is a noncancerous (benign) tumor that is made up of fat cells. This is a very common type of soft-tissue growth. Lipomas are usually found under the skin (subcutaneous). They may occur in any tissue of the body that contains fat. Common areas for lipomas to appear include the back, shoulders, buttocks, and thighs. Lipomas grow slowly, and they are usually painless. Most lipomas do not cause problems and do not require treatment. CAUSES The cause of this condition is not known. RISK FACTORS This condition is more likely to develop in:  People who are 81-71 years old.  People who have a family history of lipomas. SYMPTOMS A lipoma usually appears as a small, round bump under the skin. It may feel soft or rubbery, but the firmness can vary. Most lipomas are not painful. However, a lipoma may become painful if it is located in an area where it pushes on nerves. DIAGNOSIS A lipoma can usually be diagnosed with a physical exam. You may also have tests to confirm the diagnosis and to rule out other conditions. Tests may include:  Imaging tests, such as a CT scan or MRI.  Removal of a tissue sample to be looked at under a microscope (biopsy). TREATMENT Treatment is not needed for small lipomas that are not causing problems. If a lipoma continues to get bigger or it causes problems, removal is often the best option. Lipomas can also be removed to improve appearance. Removal of a lipoma is usually done with a surgery in which the fatty cells and the surrounding capsule are removed. Most often, a medicine that numbs the area (local anesthetic) is used for this procedure. HOME CARE INSTRUCTIONS  Keep all follow-up visits as directed by your health care provider. This is important. SEEK MEDICAL CARE IF:  Your lipoma becomes larger or hard.  Your lipoma becomes painful, red, or increasingly swollen. These could be signs of infection or a more serious condition.   This information is  not intended to replace advice given to you by your health care provider. Make sure you discuss any questions you have with your health care provider.   Document Released: 01/28/2002 Document Revised: 06/24/2014 Document Reviewed: 02/03/2014 Elsevier Interactive Patient Education 2016 [place]. Gout Gout is when your joints become red, sore, and swell (inflamed). This is caused by the buildup of uric acid crystals in the joints. Uric acid is a chemical that is normally in the blood. If the level of uric acid gets too high in the blood, these crystals form in your joints and tissues. Over time, these crystals can form into masses near the joints and tissues. These masses can destroy bone and cause the bone to look misshapen (deformed). HOME CARE   Do not take aspirin for pain.  Only take medicine as told by your doctor.  Rest the joint as much as you can. When in bed, keep sheets and blankets off painful areas.  Keep the sore joints raised (elevated).  Put warm or cold packs on painful joints. Use of warm or cold packs depends on which works best for you.  Use crutches if the painful joint is in your leg.  Drink enough fluids to keep your pee (urine) clear or pale yellow. Limit alcohol, sugary drinks, and drinks with fructose in them.  Follow your diet instructions. Pay careful attention to how much protein you eat. Include fruits, vegetables, whole grains, and fat-free or low-fat milk products in your daily diet. Talk to your doctor  or dietitian about the use of coffee, vitamin C, and cherries. These may help lower uric acid levels.  Keep a healthy body weight. GET HELP RIGHT AWAY IF:   You have watery poop (diarrhea), throw up (vomit), or have any side effects from medicines.  You do not feel better in 24 hours, or you are getting worse.  Your joint becomes suddenly more tender, and you have chills or a fever. MAKE SURE YOU:   Understand these instructions.  Will watch your  condition.  Will get help right away if you are not doing well or get worse.   This information is not intended to replace advice given to you by your health care provider. Make sure you discuss any questions you have with your health care provider.   Document Released: 11/17/2007 Document Revised: 02/28/2014 Document Reviewed: 09/21/2011 Elsevier Interactive Patient Education [employer].

## 2015-06-19 NOTE — ED Notes (Signed)
Notified dr Valere Dross of concerns for wrist

## 2015-06-19 NOTE — ED Provider Notes (Signed)
CSN: [id number]     Arrival date & time 06/19/15  1311 History   First MD Initiated Contact with Patient 06/19/15 1515     Chief Complaint  Patient presents with  . Rabies Injection  . Joint Pain   HPI  Patient is a 60 year old gentleman who was seen in the ED after a bite injury to the right palm on 4/21. He was started on a rabies vaccine series, and is here for dose #3. He woke up this morning with a hot, red, swollen, painful wrist, with decreased range of motion.  He is also concerned about a soft swelling on the back of his neck that has been present for years.  This area is not pointing/draining. No fever. No malaise, no nausea/vomiting. Feels fine otherwise. He reports a history of diabetes and hypertension, off of meds for at least the last 6 months due to financial circumstances. No prior history of gout. Does report that his mother has arthritis, and has had a knee and hip replacement.  Past Medical History  Diagnosis Date  . Diabetes mellitus without complication ([place])   . Hypertension   . Hep C w/o coma, chronic ([place])   . Heroin addiction ([place])    History reviewed. No pertinent past surgical history. Family history: Arthritis in the mother, requiring knee and hip replacement Social History  Substance Use Topics  . Smoking status: Current Every Day Smoker -- 1.00 packs/day    Types: Cigarettes  . Smokeless tobacco: None  . Alcohol Use: No    Review of Systems  All other systems reviewed and are negative.   Allergies  Tylenol  Home Medications   Prior to Admission medications   Medication Sig Start Date End Date Taking? Authorizing Provider  amLODipine (NORVASC) 10 MG tablet Take 1 tablet (10 mg total) by mouth daily. 06/15/15   Ozella Almond Ward, PA-C  amoxicillin-clavulanate (AUGMENTIN) 875-125 MG tablet Take 1 tablet by mouth 2 (two) times daily. 06/12/15   Shary Decamp, PA-C  cephALEXin (KEFLEX) 500 MG capsule 2 caps po bid x 7 days 08/01/14   Mercedes  Camprubi-Soms, PA-C  glipiZIDE (GLUCOTROL) 5 MG tablet Take 5 mg by mouth daily before breakfast.    Historical Provider, MD  hydrochlorothiazide (HYDRODIURIL) 25 MG tablet Take 1 tablet (25 mg total) by mouth daily. 06/15/15   Ozella Almond Ward, PA-C  HYDROcodone-acetaminophen (NORCO/VICODIN) 5-325 MG per tablet Take 1 tablet by mouth every 6 (six) hours as needed for moderate pain. 07/29/14   [name], MD  ibuprofen (ADVIL,MOTRIN) 800 MG tablet Take 1 tablet (800 mg total) by mouth 3 (three) times daily. 11/22/14   [name], PA-C  indomethacin (INDOCIN) 25 MG capsule Take 2 capsules (50 mg total) by mouth 3 (three) times daily with meals. 06/19/15 06/24/15  Sherlene Shams, MD  OVER THE COUNTER MEDICATION Take 1 tablet by mouth daily. Mega Red for prostate health    Historical Provider, MD  oxyCODONE (ROXICODONE) 5 MG immediate release tablet Take 1 tablet (5 mg total) by mouth every 4 (four) hours as needed for severe pain. 11/22/14   Lorayne Bender, PA-C   Meds Ordered and Administered this Visit   Medications  rabies vaccine,human diploid (IMOVAX) injection 1 mL (1 mL Intramuscular Given 06/19/15 1530)  ketorolac (TORADOL) injection 60 mg (60 mg Intramuscular Given 06/19/15 1529)     BP 131/85 mmHg  Pulse 66  Temp(Src) 98.5 F (36.9 C) (Oral)  Resp 16  SpO2 96%  Physical Exam  Constitutional: He is oriented to person, place, and time. No distress.  Alert, nicely groomed  HENT:  Head: Atraumatic.  Eyes:  Conjugate gaze, no eye redness/drainage  Neck: Neck supple.  Full range of motion in all directions  Cardiovascular: Normal rate.   Pulmonary/Chest: No respiratory distress.  Abdominal: He exhibits no distension.  Musculoskeletal:  Left wrist is quite swollen, red, diffusely tender, warm. No rash. Skin is intact. Range of motion is quite limited, due to pain and swelling.   Neurological: He is alert and oriented to person, place, and time.  Skin: Skin is warm and dry.  No  cyanosis Right palm at the hyperthenar eminence with noninflamed bite injury, no drainage, no erythema, not swollen. Appears to be healing well. At the base of the neck posteriorly, there is a 3 inch smooth supple lesion consistent with a lipoma, not fluctuant, not tender, not pointing/draining. Faint overlying erythema, and patient is rubbing it as we speak. He states this lesion has been present for months, and then says that he means years.  Nursing note and vitals reviewed.   ED Course  Procedures (including critical care time)  none  MDM   1. Inflammatory monoarthritis of left wrist:  Differential dx includes gout/pseudogout, infectious causes.  Trial of antiinflammatory tx; recheck, followup pcp/Mary Placey, or go to ED for increasing stiffness/pain/redness in wrist.    2. Lipoma of neck   3. Need for rabies vaccination    Meds ordered this encounter  Medications  . rabies vaccine,human diploid (IMOVAX) injection 1 mL    Sig:   . indomethacin (INDOCIN) 25 MG capsule    Sig: Take 2 capsules (50 mg total) by mouth 3 (three) times daily with meals.    Dispense:  30 capsule    Refill:  0  . ketorolac (TORADOL) injection 60 mg    Sig:         Sherlene Shams, MD 06/21/15 2354

## 2016-03-23 ENCOUNTER — Ambulatory Visit (HOSPITAL_COMMUNITY)
Admission: EM | Admit: 2016-03-23 | Discharge: 2016-03-23 | Disposition: A | Discharge status: Home / Self Care | Payer: BLUE CROSS/BLUE SHIELD | Attending: Family Medicine | Admitting: Family Medicine

## 2016-03-23 ENCOUNTER — Encounter (HOSPITAL_COMMUNITY): Payer: Self-pay | Admitting: Emergency Medicine

## 2016-03-23 DIAGNOSIS — R05 Cough: Secondary | ICD-10-CM | POA: Diagnosis not present

## 2016-03-23 DIAGNOSIS — R69 Illness, unspecified: Secondary | ICD-10-CM | POA: Diagnosis not present

## 2016-03-23 DIAGNOSIS — J111 Influenza due to unidentified influenza virus with other respiratory manifestations: Secondary | ICD-10-CM

## 2016-03-23 DIAGNOSIS — R059 Cough, unspecified: Secondary | ICD-10-CM

## 2016-03-23 DIAGNOSIS — J208 Acute bronchitis due to other specified organisms: Secondary | ICD-10-CM | POA: Diagnosis not present

## 2016-03-23 MED ORDER — OSELTAMIVIR PHOSPHATE 75 MG PO CAPS: 75.0000 mg | ORAL_CAPSULE | Freq: Two times a day (BID) | ORAL | 0 refills | Status: DC

## 2016-03-23 MED ORDER — BENZONATATE 100 MG PO CAPS: 200.0000 mg | ORAL_CAPSULE | Freq: Three times a day (TID) | ORAL | 0 refills | Status: DC | PRN

## 2016-03-23 MED ORDER — ALBUTEROL SULFATE HFA 108 (90 BASE) MCG/ACT IN AERS: 2.0000 | INHALATION_SPRAY | RESPIRATORY_TRACT | 0 refills | Status: DC | PRN

## 2016-03-23 MED ORDER — AZITHROMYCIN 250 MG PO TABS: 250.0000 mg | ORAL_TABLET | Freq: Every day | ORAL | 0 refills | Status: DC

## 2016-03-23 MED FILL — OSELTAMIVIR PHOS 75 MG CAP: 75 | 5 days supply | Qty: 10 | Fill #0

## 2016-03-23 MED FILL — BENZONATATE 100 MG CAPSULE: 100 | 3 days supply | Qty: 21 | Fill #0

## 2016-03-23 MED FILL — !VENTOLIN HFA INHALER: 108 (90 BAS | 20 days supply | Qty: 18 | Fill #0

## 2016-03-23 MED FILL — AZITHROMYCIN 250 MG TABLET: 250 | 5 days supply | Qty: 6 | Fill #0

## 2016-03-23 NOTE — ED Provider Notes (Signed)
CSN: [id number]     Arrival date & time 03/23/16  1111 History   First MD Initiated Contact with Patient 03/23/16 1308     Chief Complaint  Patient presents with  . Cough  . Facial Pain   (Consider location/radiation/quality/duration/timing/severity/associated sxs/prior Treatment) Patient is c/o cough and wheezing which is severe.  He states he has fever and this has been going on for 2 days.  He is a smoker.   The history is provided by the patient.  Cough  Cough characteristics:  Non-productive Sputum characteristics:  Nondescript Severity:  Severe Onset quality:  Sudden Duration:  1 week Timing:  Constant Progression:  Unchanged Chronicity:  New Smoker: yes   Context: upper respiratory infection and weather changes   Relieved by:  Nothing Worsened by:  Deep breathing and smoking Ineffective treatments:  Beta-agonist inhaler, fluids, opioids and cough suppressants Associated symptoms: chest pain     Past Medical History:  Diagnosis Date  . Diabetes mellitus without complication ([place])   . Hep C w/o coma, chronic ([place])   . Heroin addiction ([place])   . Hypertension    History reviewed. No pertinent surgical history. History reviewed. No pertinent family history. Social History  Substance Use Topics  . Smoking status: Current Every Day Smoker    Packs/day: 1.00    Types: Cigarettes  . Smokeless tobacco: Not on file  . Alcohol use No    Review of Systems  Constitutional: Positive for fatigue.  Eyes: Negative.   Respiratory: Positive for cough.   Cardiovascular: Positive for chest pain.  Gastrointestinal: Negative.   Endocrine: Negative.   Genitourinary: Negative.   Musculoskeletal: Negative.   Skin: Negative.   Allergic/Immunologic: Negative.   Neurological: Negative.   Hematological: Negative.   Psychiatric/Behavioral: Negative.     Allergies  Tylenol [acetaminophen]  Home Medications   Prior to Admission medications   Medication Sig Start Date End  Date Taking? Authorizing Provider  amLODipine (NORVASC) 10 MG tablet Take 1 tablet (10 mg total) by mouth daily. 06/15/15  Yes Jaime Pilcher Ward, PA-C  glipiZIDE (GLUCOTROL) 5 MG tablet Take 5 mg by mouth daily before breakfast.   Yes Historical Provider, MD  hydrochlorothiazide (HYDRODIURIL) 25 MG tablet Take 1 tablet (25 mg total) by mouth daily. 06/15/15  Yes Jaime Pilcher Ward, PA-C  albuterol (PROVENTIL HFA;VENTOLIN HFA) 108 (90 Base) MCG/ACT inhaler Inhale 2 puffs into the lungs every 4 (four) hours as needed for wheezing or shortness of breath. 03/23/16   Lysbeth Penner, FNP  azithromycin (ZITHROMAX) 250 MG tablet Take 1 tablet (250 mg total) by mouth daily. Take first 2 tablets together, then 1 every day until finished. 03/23/16   Lysbeth Penner, FNP  benzonatate (TESSALON) 100 MG capsule Take 2 capsules (200 mg total) by mouth 3 (three) times daily as needed for cough. 03/23/16   Lysbeth Penner, FNP  oseltamivir (TAMIFLU) 75 MG capsule Take 1 capsule (75 mg total) by mouth every 12 (twelve) hours. 03/23/16   Lysbeth Penner, FNP   Meds Ordered and Administered this Visit  Medications - No data to display  BP 117/84 (BP Location: Right Arm)   Pulse 78   Temp 98.4 F (36.9 C) (Oral)   Resp 20   SpO2 98%  No data found.   Physical Exam  Constitutional: He appears well-developed and well-nourished.  HENT:  Head: Normocephalic and atraumatic.  Right Ear: External ear normal.  Left Ear: External ear normal.  Mouth/Throat: Oropharynx is clear and  moist.  Eyes: Conjunctivae and EOM are normal. Pupils are equal, round, and reactive to light.  Neck: Normal range of motion. Neck supple.  Cardiovascular: Normal rate, regular rhythm and normal heart sounds.   Pulmonary/Chest: Effort normal. He has wheezes.  Nursing note and vitals reviewed.   Urgent Care Course     Procedures (including critical care time)  Labs Review Labs Reviewed - No data to display  Imaging Review No  results found.   Visual Acuity Review  Right Eye Distance:   Left Eye Distance:   Bilateral Distance:    Right Eye Near:   Left Eye Near:    Bilateral Near:         MDM   1. Cough   2. Influenza-like illness   3. Acute bronchitis due to other specified organisms     Tamiflu Zpak Tessalon  Push po fluids, rest, tylenol and motrin otc prn as directed for fever, arthralgias, and myalgias.  Follow up prn if sx's continue or persist.   Lysbeth Penner, FNP 03/23/16 1346

## 2016-03-23 NOTE — ED Triage Notes (Signed)
The patient presented to the [facility] with a complaint of a cough with left side rib pain and nasal congestion and pressure x 4 days.

## 2016-04-10 ENCOUNTER — Emergency Department (HOSPITAL_COMMUNITY): Payer: BLUE CROSS/BLUE SHIELD

## 2016-04-10 ENCOUNTER — Emergency Department (HOSPITAL_COMMUNITY)
Admission: EM | Admit: 2016-04-10 | Discharge: 2016-04-10 | Disposition: A | Discharge status: Home / Self Care | Payer: BLUE CROSS/BLUE SHIELD | Attending: Emergency Medicine | Admitting: Emergency Medicine

## 2016-04-10 ENCOUNTER — Encounter (HOSPITAL_COMMUNITY): Payer: Self-pay

## 2016-04-10 DIAGNOSIS — I10 Essential (primary) hypertension: Secondary | ICD-10-CM | POA: Insufficient documentation

## 2016-04-10 DIAGNOSIS — Z79899 Other long term (current) drug therapy: Secondary | ICD-10-CM | POA: Insufficient documentation

## 2016-04-10 DIAGNOSIS — F17210 Nicotine dependence, cigarettes, uncomplicated: Secondary | ICD-10-CM | POA: Insufficient documentation

## 2016-04-10 DIAGNOSIS — E119 Type 2 diabetes mellitus without complications: Secondary | ICD-10-CM | POA: Insufficient documentation

## 2016-04-10 DIAGNOSIS — R0789 Other chest pain: Secondary | ICD-10-CM

## 2016-04-10 LAB — COMPREHENSIVE METABOLIC PANEL
ALBUMIN: 4.1 g/dL (ref 3.5–5.0)
ALT: 40 U/L (ref 17–63)
ANION GAP: 9 (ref 5–15)
AST: 40 U/L (ref 15–41)
Alkaline Phosphatase: 73 U/L (ref 38–126)
BILIRUBIN TOTAL: 1.1 mg/dL (ref 0.3–1.2)
BUN: 9 mg/dL (ref 6–20)
CO2: 28 mmol/L (ref 22–32)
Calcium: 9.2 mg/dL (ref 8.9–10.3)
Chloride: 97 mmol/L — ABNORMAL LOW (ref 101–111)
Creatinine, Ser: 0.82 mg/dL (ref 0.61–1.24)
GFR calc Af Amer: >60 mL/min (ref >60)
GFR calc non Af Amer: >60 mL/min (ref >60)
GLUCOSE: 96 mg/dL (ref 65–99)
POTASSIUM: 2.6 mmol/L — AB (ref 3.5–5.1)
SODIUM: 134 mmol/L — AB (ref 135–145)
TOTAL PROTEIN: 8.7 g/dL — AB (ref 6.5–8.1)

## 2016-04-10 LAB — CBC
HCT: 42.4 % (ref 39.0–52.0)
Hemoglobin: 14.2 g/dL (ref 13.0–17.0)
MCH: 28 pg (ref 26.0–34.0)
MCHC: 33.5 g/dL (ref 30.0–36.0)
MCV: 83.5 fL (ref 78.0–100.0)
Platelets: 133 10*3/uL — ABNORMAL LOW (ref 150–400)
RBC: 5.08 MIL/uL (ref 4.22–5.81)
RDW: 13.5 % (ref 11.5–15.5)
WBC: 5.1 10*3/uL (ref 4.0–10.5)

## 2016-04-10 LAB — I-STAT TROPONIN, ED: Troponin i, poc: 0 ng/mL (ref 0.00–0.08)

## 2016-04-10 LAB — PROTIME-INR
INR: 0.98
PROTHROMBIN TIME: 13 s (ref 11.4–15.2)

## 2016-04-10 LAB — MAGNESIUM: Magnesium: 1.9 mg/dL (ref 1.7–2.4)

## 2016-04-10 LAB — TROPONIN I

## 2016-04-10 MED ORDER — ASPIRIN 81 MG PO CHEW: 324.0000 mg | CHEWABLE_TABLET | Freq: Once | ORAL | Status: DC

## 2016-04-10 MED ORDER — POTASSIUM CHLORIDE CRYS ER 20 MEQ PO TBCR: 40.0000 meq | EXTENDED_RELEASE_TABLET | Freq: Two times a day (BID) | ORAL | 0 refills | Status: DC

## 2016-04-10 MED ORDER — KETOROLAC TROMETHAMINE 15 MG/ML IJ SOLN
15.0000 mg | Freq: Once | INTRAMUSCULAR | Status: AC
  Administered 2016-04-10: 15 mg via INTRAVENOUS
  Filled 2016-04-10: qty 1

## 2016-04-10 MED ORDER — OMEPRAZOLE 20 MG PO CPDR: 20.0000 mg | DELAYED_RELEASE_CAPSULE | Freq: Every day | ORAL | 0 refills | Status: DC

## 2016-04-10 MED ORDER — GI COCKTAIL ~~LOC~~
30.0000 mL | Freq: Once | ORAL | Status: AC
  Administered 2016-04-10: 30 mL via ORAL
  Filled 2016-04-10: qty 30

## 2016-04-10 NOTE — ED Notes (Signed)
Papers reviewed with patient and family member. Understanding verbalized

## 2016-04-10 NOTE — ED Provider Notes (Signed)
Blood pressure 139/87, resp. rate 16.  Assuming care from Dr. Vanita Panda.  In short, Adrian Castillo is a 61 y.o. male with a chief complaint of Chest Pain .  Refer to the original H&P for additional details.  The current plan of care is to follow repeat troponin and reassess.  04:30 PM Repeat troponin is negative. Patient does have hypokalemia 2.6 on his chemistry. Plan for Prilosec with PCP follow up. Will add on Kdur to take for the next 4 days and follow with PCP for recheck of potassium. Discussed return precautions in detail and follow up plan.   Nanda Quinton, MD    Margette Fast, MD 04/10/16 [phone]

## 2016-04-10 NOTE — ED Provider Notes (Signed)
[name] DEPT Provider Note   CSN: [id number] Arrival date & time: 04/10/16  1216     History   Chief Complaint Chief Complaint  Patient presents with  . Chest Pain    HPI Adrian Castillo is a 61 y.o. male.  HPI  Patient presents with sternal chest pain. Pain began about 30 minutes prior to ED arrival. Since onset the pain has been persistent. No associated dyspnea, nausea, no vomiting. Patient has multiple medical issues including hepatitis C for which she is about to begin therapy. Patient also has history of heroin addiction, is currently taking methadone. Since onset of chest pain, no clear alleviating or exacerbating factors.   Past Medical History:  Diagnosis Date  . Diabetes mellitus without complication ([place])   . Hep C w/o coma, chronic ([place])   . Heroin addiction ([place])   . Hypertension     There are no active problems to display for this patient.   History reviewed. No pertinent surgical history.     Home Medications    Prior to Admission medications   Medication Sig Start Date End Date Taking? Authorizing Provider  albuterol (PROVENTIL HFA;VENTOLIN HFA) 108 (90 Base) MCG/ACT inhaler Inhale 2 puffs into the lungs every 4 (four) hours as needed for wheezing or shortness of breath. 03/23/16   Lysbeth Penner, FNP  amLODipine (NORVASC) 10 MG tablet Take 1 tablet (10 mg total) by mouth daily. 06/15/15   Ozella Almond Ward, PA-C  azithromycin (ZITHROMAX) 250 MG tablet Take 1 tablet (250 mg total) by mouth daily. Take first 2 tablets together, then 1 every day until finished. 03/23/16   Lysbeth Penner, FNP  benzonatate (TESSALON) 100 MG capsule Take 2 capsules (200 mg total) by mouth 3 (three) times daily as needed for cough. 03/23/16   Lysbeth Penner, FNP  glipiZIDE (GLUCOTROL) 5 MG tablet Take 5 mg by mouth daily before breakfast.    Historical Provider, MD  hydrochlorothiazide (HYDRODIURIL) 25 MG tablet Take 1 tablet (25 mg total) by mouth daily. 06/15/15    Ozella Almond Ward, PA-C  omeprazole (PRILOSEC) 20 MG capsule Take 1 capsule (20 mg total) by mouth daily. Take one tablet daily 04/10/16   Carmin Muskrat, MD  oseltamivir (TAMIFLU) 75 MG capsule Take 1 capsule (75 mg total) by mouth every 12 (twelve) hours. 03/23/16   Lysbeth Penner, FNP    Family History No family history on file.  Social History Social History  Substance Use Topics  . Smoking status: Current Every Day Smoker    Packs/day: 1.00    Types: Cigarettes  . Smokeless tobacco: Never Used  . Alcohol use No     Allergies   Tylenol [acetaminophen]   Review of Systems Review of Systems  Constitutional:       Per HPI, otherwise negative  HENT:       Per HPI, otherwise negative  Respiratory:       Per HPI, otherwise negative  Cardiovascular:       Per HPI, otherwise negative  Gastrointestinal: Negative for vomiting.  Endocrine:       Negative aside from HPI  Genitourinary:       Neg aside from HPI   Musculoskeletal:       Per HPI, otherwise negative  Skin: Negative.   Neurological: Negative for syncope.  Psychiatric/Behavioral:       Substance abuse     Physical Exam Updated Vital Signs BP 139/87   Resp 16   Physical Exam  Constitutional: He is oriented to person, place, and time. He appears well-developed. No distress.  HENT:  Head: Normocephalic and atraumatic.  Eyes: Conjunctivae and EOM are normal.  Cardiovascular: Normal rate and regular rhythm.   Pulmonary/Chest: Effort normal. No stridor. No respiratory distress.  Tenderness to palpation about the sternum  Abdominal: He exhibits no distension. There is no tenderness.  Musculoskeletal: He exhibits no edema.  Neurological: He is alert and oriented to person, place, and time.  Skin: Skin is warm and dry.  Psychiatric: He has a normal mood and affect.  Nursing note and vitals reviewed.    ED Treatments / Results  Labs (all labs ordered are listed, but only abnormal results are  displayed) Labs Reviewed  CBC - Abnormal; Notable for the following:       Result Value   Platelets 133 (*)    All other components within normal limits  COMPREHENSIVE METABOLIC PANEL - Abnormal; Notable for the following:    Sodium 134 (*)    Potassium 2.6 (*)    Chloride 97 (*)    Total Protein 8.7 (*)    All other components within normal limits  MAGNESIUM  PROTIME-INR  TROPONIN I    EKG  EKG Interpretation  Date/Time:    Ventricular Rate:  79 PR Interval:    QRS Duration: 103 QT Interval:  415 QTC Calculation: 476 R Axis:   40 Text Interpretation:  Sinus rhythm T wave abnormality Artifact Abnormal ekg Confirmed by Carmin Muskrat  MD [phone]) on 04/10/2016 1:42:09 PM       Radiology Dg Chest 2 View  Result Date: 04/10/2016 CLINICAL DATA:  Central chest pain. EXAM: CHEST  2 VIEW COMPARISON:  11/22/2014 FINDINGS: Normal heart size and stable mild aortic tortuosity. There is no edema, consolidation, effusion, or pneumothorax. Minor scarring at the peripheral left base. No acute osseous finding. IMPRESSION: No evidence of active disease. Electronically Signed   By: Monte Fantasia M.D.   On: 04/10/2016 13:15    Procedures Procedures (including critical care time)  Medications Ordered in ED Medications  aspirin chewable tablet 324 mg (324 mg Oral Not Given 04/10/16 1238)  gi cocktail (Maalox,Lidocaine,Donnatal) (30 mLs Oral Given 04/10/16 1319)  ketorolac (TORADOL) 15 MG/ML injection 15 mg (15 mg Intravenous Given 04/10/16 1319)     Initial Impression / Assessment and Plan / ED Course  I have reviewed the triage vital signs and the nursing notes.  Pertinent labs & imaging results that were available during my care of the patient were reviewed by me and considered in my medical decision making (see chart for details).   1:42 PM After the provision of Toradol, GI cocktail patient has no ongoing pain.  I discussed all findings the patient and to family members. We  discussed the importance of second troponin to provide additional reassurance that chest pain episode is not cardiogenic. This patient who presented with sudden onset chest pain, but in no distress, with no associated dyspnea, syncope, nausea, vomiting, fever, and with resolution following GI cocktail, has little evidence for ongoing ACS. Patient is awaiting a second troponin, and if this is negative, discharge is anticipated with outpatient follow-up.  Care endorsed to Dr. Laver[facility] Baltimore on sign-out.   Final Clinical Impressions(s) / ED Diagnoses   Final diagnoses:  Atypical chest pain    New Prescriptions New Prescriptions   OMEPRAZOLE (PRILOSEC) 20 MG CAPSULE    Take 1 capsule (20 mg total) by mouth daily. Take one tablet daily  Carmin Muskrat, MD 04/10/16 1343

## 2016-04-10 NOTE — Discharge Instructions (Signed)
As discussed, your evaluation today has been largely reassuring.  But, it is important that you monitor your condition carefully, and do not hesitate to return to the ED if you develop new, or concerning changes in your condition. ? ?Otherwise, please follow-up with your physician for appropriate ongoing care. ? ?

## 2016-04-10 NOTE — ED Triage Notes (Signed)
Pt. Woke up 45 min ago [facility] [place], dull diffuse CP. Takes methadone, EMS gave 324 ASA, no nitro

## 2016-06-21 DIAGNOSIS — B182 Chronic viral hepatitis C: Secondary | ICD-10-CM

## 2016-06-21 HISTORY — DX: Chronic viral hepatitis C: B18.2

## 2016-08-15 ENCOUNTER — Encounter: Payer: Self-pay | Admitting: Gastroenterology

## 2017-01-23 ENCOUNTER — Ambulatory Visit: Payer: Self-pay

## 2017-01-27 ENCOUNTER — Emergency Department (HOSPITAL_COMMUNITY)
Admission: EM | Admit: 2017-01-27 | Discharge: 2017-01-27 | Disposition: A | Discharge status: Home / Self Care | Payer: Self-pay | Attending: Emergency Medicine | Admitting: Emergency Medicine

## 2017-01-27 ENCOUNTER — Encounter (HOSPITAL_COMMUNITY): Payer: Self-pay

## 2017-01-27 ENCOUNTER — Emergency Department (HOSPITAL_COMMUNITY): Payer: Self-pay

## 2017-01-27 ENCOUNTER — Other Ambulatory Visit: Payer: Self-pay

## 2017-01-27 DIAGNOSIS — Z7984 Long term (current) use of oral hypoglycemic drugs: Secondary | ICD-10-CM | POA: Insufficient documentation

## 2017-01-27 DIAGNOSIS — E119 Type 2 diabetes mellitus without complications: Secondary | ICD-10-CM | POA: Insufficient documentation

## 2017-01-27 DIAGNOSIS — R1031 Right lower quadrant pain: Secondary | ICD-10-CM | POA: Insufficient documentation

## 2017-01-27 DIAGNOSIS — N50819 Testicular pain, unspecified: Secondary | ICD-10-CM | POA: Insufficient documentation

## 2017-01-27 DIAGNOSIS — F17210 Nicotine dependence, cigarettes, uncomplicated: Secondary | ICD-10-CM | POA: Insufficient documentation

## 2017-01-27 DIAGNOSIS — Z79899 Other long term (current) drug therapy: Secondary | ICD-10-CM | POA: Insufficient documentation

## 2017-01-27 DIAGNOSIS — I10 Essential (primary) hypertension: Secondary | ICD-10-CM | POA: Insufficient documentation

## 2017-01-27 DIAGNOSIS — R3129 Other microscopic hematuria: Secondary | ICD-10-CM | POA: Insufficient documentation

## 2017-01-27 DIAGNOSIS — R109 Unspecified abdominal pain: Secondary | ICD-10-CM

## 2017-01-27 LAB — URINALYSIS, ROUTINE W REFLEX MICROSCOPIC
Bilirubin Urine: NEGATIVE
Glucose, UA: NEGATIVE mg/dL
KETONES UR: NEGATIVE mg/dL
Leukocytes, UA: NEGATIVE
Nitrite: NEGATIVE
PROTEIN: 30 mg/dL — AB
Specific Gravity, Urine: 1.025 (ref 1.005–1.030)
pH: 6 (ref 5.0–8.0)

## 2017-01-27 LAB — BASIC METABOLIC PANEL
ANION GAP: 7 (ref 5–15)
BUN: 11 mg/dL (ref 6–20)
CALCIUM: 9.2 mg/dL (ref 8.9–10.3)
CO2: 27 mmol/L (ref 22–32)
Chloride: 103 mmol/L (ref 101–111)
Creatinine, Ser: 0.8 mg/dL (ref 0.61–1.24)
Glucose, Bld: 193 mg/dL — ABNORMAL HIGH (ref 65–99)
POTASSIUM: 4.9 mmol/L (ref 3.5–5.1)
SODIUM: 137 mmol/L (ref 135–145)

## 2017-01-27 LAB — CBC
HEMATOCRIT: 44.7 % (ref 39.0–52.0)
HEMOGLOBIN: 14.9 g/dL (ref 13.0–17.0)
MCH: 27.3 pg (ref 26.0–34.0)
MCHC: 33.3 g/dL (ref 30.0–36.0)
MCV: 82 fL (ref 78.0–100.0)
Platelets: 163 10*3/uL (ref 150–400)
RBC: 5.45 MIL/uL (ref 4.22–5.81)
RDW: 14.9 % (ref 11.5–15.5)
WBC: 6.9 10*3/uL (ref 4.0–10.5)

## 2017-01-27 MED ORDER — MORPHINE SULFATE (PF) 4 MG/ML IV SOLN
4.0000 mg | Freq: Once | INTRAVENOUS | Status: AC
  Administered 2017-01-27: 4 mg via INTRAVENOUS
  Filled 2017-01-27: qty 1

## 2017-01-27 MED ORDER — CEPHALEXIN 500 MG PO CAPS: 500.0000 mg | ORAL_CAPSULE | Freq: Four times a day (QID) | ORAL | 0 refills | Status: DC

## 2017-01-27 MED ORDER — ONDANSETRON HCL 4 MG/2ML IJ SOLN
4.0000 mg | Freq: Once | INTRAMUSCULAR | Status: AC
  Administered 2017-01-27: 4 mg via INTRAVENOUS
  Filled 2017-01-27: qty 2

## 2017-01-27 MED ORDER — KETOROLAC TROMETHAMINE 10 MG PO TABS: 10.0000 mg | ORAL_TABLET | Freq: Four times a day (QID) | ORAL | 0 refills | Status: DC | PRN

## 2017-01-27 MED ORDER — CEPHALEXIN 500 MG PO CAPS: 500.0000 mg | ORAL_CAPSULE | Freq: Two times a day (BID) | ORAL | 0 refills | Status: DC

## 2017-01-27 MED ORDER — SODIUM CHLORIDE 0.9 % IV BOLUS (SEPSIS)
1000.0000 mL | Freq: Once | INTRAVENOUS | Status: AC
  Administered 2017-01-27: 1000 mL via INTRAVENOUS

## 2017-01-27 MED ORDER — ONDANSETRON HCL 4 MG PO TABS: 4.0000 mg | ORAL_TABLET | Freq: Four times a day (QID) | ORAL | 0 refills | Status: DC

## 2017-01-27 MED ORDER — IBUPROFEN 400 MG PO TABS
400.0000 mg | ORAL_TABLET | Freq: Once | ORAL | Status: AC | PRN
  Administered 2017-01-27: 400 mg via ORAL
  Filled 2017-01-27: qty 1

## 2017-01-27 NOTE — ED Provider Notes (Signed)
[facility] EMERGENCY DEPARTMENT Provider Note   CSN: [id number] Arrival date & time: 01/27/17  1116     History   Chief Complaint Chief Complaint  Patient presents with  . Flank Pain    HPI Adrian Castillo is a 61 y.o. male with history of hypertension, heroin addiction, diabetes, hepatitis C who presents with a 2-day history of right flank pain.  His pain is waxing and waning.  It radiates to his groin intermittently.  He denies any fevers.  He has had associated dysuria.  He has also had associated nausea and vomiting.  He has taken ibuprofen at home without relief.  He denies any chest pain, shortness of breath. He has no history of kidney stones.  HPI  Past Medical History:  Diagnosis Date  . Diabetes mellitus without complication ([place])   . Hep C w/o coma, chronic ([place])   . Heroin addiction ([place])   . Hypertension     There are no active problems to display for this patient.   History reviewed. No pertinent surgical history.     Home Medications    Prior to Admission medications   Medication Sig Start Date End Date Taking? Authorizing Provider  albuterol (PROVENTIL HFA;VENTOLIN HFA) 108 (90 Base) MCG/ACT inhaler Inhale 2 puffs into the lungs every 4 (four) hours as needed for wheezing or shortness of breath. 03/23/16  Yes Lysbeth Penner, FNP  albuterol (PROVENTIL) (2.5 MG/3ML) 0.083% nebulizer solution Take 2.5 mg by nebulization every 6 (six) hours as needed for shortness of breath or wheezing.   Yes [provider]  amLODipine (NORVASC) 10 MG tablet Take 1 tablet (10 mg total) by mouth daily. 06/15/15  Yes Ward, Ozella Almond, PA-C  FLUoxetine (PROZAC) 20 MG capsule Take 20 mg by mouth daily.   Yes [provider]  furosemide (LASIX) 20 MG tablet Take 20 mg by mouth daily.   Yes [provider]  glipiZIDE (GLUCOTROL) 10 MG tablet Take 10 mg by mouth daily.    Yes [provider]  ibuprofen (ADVIL,MOTRIN) 800 MG  tablet Take 800-1,600 mg by mouth every 8 (eight) hours as needed (for pain).    Yes [provider]  naproxen (NAPROSYN) 500 MG tablet Take 500 mg by mouth 2 (two) times daily with a meal.   Yes [provider]  omeprazole (PRILOSEC) 20 MG capsule Take 1 capsule (20 mg total) by mouth daily. Take one tablet daily Patient taking differently: Take 20 mg by mouth daily as needed (for heartburn of reflux symptoms).  04/10/16  Yes Carmin Muskrat, MD  potassium chloride SA (K-DUR,KLOR-CON) 20 MEQ tablet Take 2 tablets (40 mEq total) by mouth 2 (two) times daily. Patient taking differently: Take 20 mEq by mouth daily as needed (for leg cramps).  04/10/16 01/27/17 Yes Long, Wonda Olds, MD  benzonatate (TESSALON) 100 MG capsule Take 2 capsules (200 mg total) by mouth 3 (three) times daily as needed for cough. Patient not taking: Reported on 01/27/2017 03/23/16   Lysbeth Penner, FNP  cephALEXin (KEFLEX) 500 MG capsule Take 1 capsule (500 mg total) by mouth 2 (two) times daily. 01/27/17   Kristyl Athens, Bea Graff, PA-C  hydrochlorothiazide (HYDRODIURIL) 25 MG tablet Take 1 tablet (25 mg total) by mouth daily. Patient not taking: Reported on 01/27/2017 06/15/15   Ward, Ozella Almond, PA-C  ketorolac (TORADOL) 10 MG tablet Take 1 tablet (10 mg total) by mouth every 6 (six) hours as needed. Make sure to take WITH food  and do not combine with ibuprofen. 01/27/17   Louise Rawson, Bea Graff, PA-C  METHADONE HCL PO Take 22 mg by mouth daily.    [provider]  ondansetron (ZOFRAN) 4 MG tablet Take 1 tablet (4 mg total) by mouth every 6 (six) hours. 01/27/17   Xyon Lukasik M, PA-C  SUBOXONE 4-1 MG FILM Place 1 Film under the tongue See admin instructions. 1 film in the evening as needed/as directed 12/21/16   [provider]  SUBOXONE 8-2 MG FILM Place 1 Film under the tongue See admin instructions. 1 film in the morning as needed/as directed 12/21/16   [provider]    Family History No  family history on file.  Social History Social History   Tobacco Use  . Smoking status: Current Every Day Smoker    Packs/day: 0.50    Types: Cigarettes  . Smokeless tobacco: Never Used  Substance Use Topics  . Alcohol use: No  . Drug use: No    Comment: HEROIN     Allergies   Crab [shellfish allergy] and Tylenol [acetaminophen]   Review of Systems Review of Systems  Constitutional: Negative for chills and fever.  HENT: Negative for facial swelling and sore throat.   Respiratory: Negative for shortness of breath.   Cardiovascular: Negative for chest pain.  Gastrointestinal: Positive for abdominal pain (R lower radiating from R flank). Negative for nausea and vomiting.  Genitourinary: Positive for dysuria, flank pain (R) and testicular pain (Radiation from R flank).  Musculoskeletal: Negative for back pain.  Skin: Negative for rash and wound.  Neurological: Negative for headaches.  Psychiatric/Behavioral: The patient is not nervous/anxious.      Physical Exam Updated Vital Signs BP 123/73 (BP Location: Right Arm)   Pulse 73   Temp 98.2 F (36.8 C) (Oral)   Resp 16   Ht 5\' 11"  (1.803 m)   Wt 90.7 kg (200 lb)   SpO2 100%   BMI 27.89 kg/m   Physical Exam  Constitutional: He appears well-developed and well-nourished. No distress.  HENT:  Head: Normocephalic and atraumatic.  Mouth/Throat: Oropharynx is clear and moist. No oropharyngeal exudate.  Eyes: Conjunctivae are normal. Pupils are equal, round, and reactive to light. Right eye exhibits no discharge. Left eye exhibits no discharge. No scleral icterus.  Neck: Normal range of motion. Neck supple. No thyromegaly present.  Cardiovascular: Normal rate, regular rhythm, normal heart sounds and intact distal pulses. Exam reveals no gallop and no friction rub.  No murmur heard. Pulmonary/Chest: Effort normal and breath sounds normal. No stridor. No respiratory distress. He has no wheezes. He has no rales.  Abdominal:  Soft. Bowel sounds are normal. He exhibits no distension. There is tenderness in the right lower quadrant. There is CVA tenderness. There is no rebound and no guarding.    Musculoskeletal: He exhibits no edema.  Lymphadenopathy:    He has no cervical adenopathy.  Neurological: He is alert. Coordination normal.  Skin: Skin is warm and dry. No rash noted. He is not diaphoretic. No pallor.  Psychiatric: He has a normal mood and affect.  Nursing note and vitals reviewed.    ED Treatments / Results  Labs (all labs ordered are listed, but only abnormal results are displayed) Labs Reviewed  URINE CULTURE - Abnormal; Notable for the following components:      Result Value   Culture <10,000 COLONIES/mL INSIGNIFICANT GROWTH (*)    All other components within normal limits  URINALYSIS, ROUTINE W REFLEX MICROSCOPIC - Abnormal;  Notable for the following components:   Hgb urine dipstick MODERATE (*)    Protein, ur 30 (*)    Bacteria, UA RARE (*)    Squamous Epithelial / LPF 0-5 (*)    All other components within normal limits  BASIC METABOLIC PANEL - Abnormal; Notable for the following components:   Glucose, Bld 193 (*)    All other components within normal limits  CBC    EKG  EKG Interpretation None       Radiology No results found.  Procedures Procedures (including critical care time)  Medications Ordered in ED Medications  ibuprofen (ADVIL,MOTRIN) tablet 400 mg (400 mg Oral Given 01/27/17 1129)  morphine 4 MG/ML injection 4 mg (4 mg Intravenous Given 01/27/17 1933)  ondansetron (ZOFRAN) injection 4 mg (4 mg Intravenous Given 01/27/17 1933)  sodium chloride 0.9 % bolus 1,000 mL (0 mLs Intravenous Stopped 01/27/17 2052)     Initial Impression / Assessment and Plan / ED Course  I have reviewed the triage vital signs and the nursing notes.  Pertinent labs & imaging results that were available during my care of the patient were reviewed by me and considered in my medical  decision making (see chart for details).     Patient with hematuria and right flank pain.  Labs unremarkable.  CT renal stone study shows several 1-2 mm calculi in the left kidney, but no hydronephrosis or ureteral calculus on either side; otherwise benign appearing.  Patient may have recently passed a stone to account for symptoms.  Otherwise, no findings to account for his symptoms on the CT.  Will discharge home with Keflex, per recommendation of Dr. Rogene Houston, and follow-up to urology.  We will also discharge home with Toradol Zofran for symptomatic control.  Patient advised to only take Toradol as prescribed and with food.  Return precautions discussed.  Patient understands and agrees with plan.  Patient vitals stable and discharged in satisfactory condition.  Final Clinical Impressions(s) / ED Diagnoses   Final diagnoses:  Right flank pain  Other microscopic hematuria    ED Discharge Orders        Ordered    ketorolac (TORADOL) 10 MG tablet  Every 6 hours PRN     01/27/17 2023    ondansetron (ZOFRAN) 4 MG tablet  Every 6 hours     01/27/17 2023    cephALEXin (KEFLEX) 500 MG capsule  4 times daily,   Status:  Discontinued     01/27/17 2023    cephALEXin (KEFLEX) 500 MG capsule  2 times daily     01/27/17 2025       Frederica Kuster, PA-C 01/30/17 6606    Fredia Sorrow, MD 02/04/17 1539

## 2017-01-27 NOTE — Discharge Instructions (Signed)
Medications: Toradol, Zofran, Keflex  Treatment: Take Toradol as prescribed for your pain instead of ibuprofen.  Make sure to take with food.  Take Zofran every 6 hours as needed for nausea or vomiting.  Take Keflex as prescribed until completed.  Avoid artificial sweeteners.  Follow-up: Please follow-up with urology as soon as possible for further evaluation and treatment.  Please return to the emergency department if you develop any new or worsening symptoms.

## 2017-01-27 NOTE — ED Triage Notes (Signed)
Per Pt, PT is coming from home with complaints of right flank pain that radiates to his groin along with some burning with urination. Denies blood in urine. Reports one episode of nausea/vomiting.

## 2017-01-29 LAB — URINE CULTURE
Culture: <10000 — AB
Special Requests: NORMAL

## 2017-09-18 ENCOUNTER — Emergency Department (HOSPITAL_COMMUNITY): Payer: Self-pay

## 2017-09-18 ENCOUNTER — Encounter (HOSPITAL_COMMUNITY): Payer: Self-pay | Admitting: Emergency Medicine

## 2017-09-18 ENCOUNTER — Emergency Department (HOSPITAL_COMMUNITY)
Admission: EM | Admit: 2017-09-18 | Discharge: 2017-09-18 | Disposition: A | Discharge status: Home / Self Care | Payer: Self-pay | Attending: Emergency Medicine | Admitting: Emergency Medicine

## 2017-09-18 ENCOUNTER — Other Ambulatory Visit: Payer: Self-pay

## 2017-09-18 DIAGNOSIS — R1011 Right upper quadrant pain: Secondary | ICD-10-CM | POA: Insufficient documentation

## 2017-09-18 DIAGNOSIS — K429 Umbilical hernia without obstruction or gangrene: Secondary | ICD-10-CM | POA: Insufficient documentation

## 2017-09-18 DIAGNOSIS — F17210 Nicotine dependence, cigarettes, uncomplicated: Secondary | ICD-10-CM | POA: Insufficient documentation

## 2017-09-18 DIAGNOSIS — I10 Essential (primary) hypertension: Secondary | ICD-10-CM | POA: Insufficient documentation

## 2017-09-18 DIAGNOSIS — Z7984 Long term (current) use of oral hypoglycemic drugs: Secondary | ICD-10-CM | POA: Insufficient documentation

## 2017-09-18 DIAGNOSIS — E119 Type 2 diabetes mellitus without complications: Secondary | ICD-10-CM | POA: Insufficient documentation

## 2017-09-18 DIAGNOSIS — Z79899 Other long term (current) drug therapy: Secondary | ICD-10-CM | POA: Insufficient documentation

## 2017-09-18 LAB — URINALYSIS, ROUTINE W REFLEX MICROSCOPIC
BILIRUBIN URINE: NEGATIVE
Glucose, UA: NEGATIVE mg/dL
Ketones, ur: NEGATIVE mg/dL
Leukocytes, UA: NEGATIVE
Nitrite: NEGATIVE
Protein, ur: NEGATIVE mg/dL
SPECIFIC GRAVITY, URINE: 1.024 (ref 1.005–1.030)
pH: 5 (ref 5.0–8.0)

## 2017-09-18 LAB — COMPREHENSIVE METABOLIC PANEL
ALBUMIN: 3.9 g/dL (ref 3.5–5.0)
ALT: 29 U/L (ref 0–44)
AST: 23 U/L (ref 15–41)
Alkaline Phosphatase: 62 U/L (ref 38–126)
Anion gap: 10 (ref 5–15)
BUN: 14 mg/dL (ref 8–23)
CHLORIDE: 101 mmol/L (ref 98–111)
CO2: 24 mmol/L (ref 22–32)
CREATININE: 1.36 mg/dL — AB (ref 0.61–1.24)
Calcium: 9.2 mg/dL (ref 8.9–10.3)
GFR calc Af Amer: >60 mL/min (ref >60)
GFR calc non Af Amer: 55 mL/min — ABNORMAL LOW (ref >60)
GLUCOSE: 272 mg/dL — AB (ref 70–99)
POTASSIUM: 3.6 mmol/L (ref 3.5–5.1)
SODIUM: 135 mmol/L (ref 135–145)
Total Bilirubin: 0.8 mg/dL (ref 0.3–1.2)
Total Protein: 8 g/dL (ref 6.5–8.1)

## 2017-09-18 LAB — CBC
HEMATOCRIT: 53.4 % — AB (ref 39.0–52.0)
Hemoglobin: 17.2 g/dL — ABNORMAL HIGH (ref 13.0–17.0)
MCH: 26.7 pg (ref 26.0–34.0)
MCHC: 32.2 g/dL (ref 30.0–36.0)
MCV: 82.9 fL (ref 78.0–100.0)
Platelets: 221 10*3/uL (ref 150–400)
RBC: 6.44 MIL/uL — ABNORMAL HIGH (ref 4.22–5.81)
RDW: 13.9 % (ref 11.5–15.5)
WBC: 6.9 10*3/uL (ref 4.0–10.5)

## 2017-09-18 LAB — LIPASE, BLOOD: Lipase: 48 U/L (ref 11–51)

## 2017-09-18 MED ORDER — IOHEXOL 300 MG/ML  SOLN
100.0000 mL | Freq: Once | INTRAMUSCULAR | Status: AC | PRN
  Administered 2017-09-18: 100 mL via INTRAVENOUS

## 2017-09-18 MED ORDER — LIDOCAINE 5 % EX PTCH: 1.0000 | MEDICATED_PATCH | CUTANEOUS | 0 refills | Status: DC

## 2017-09-18 NOTE — ED Triage Notes (Signed)
Pt. Stated, I started having right side pain on Friday. No other symptoms, the pain is right under my right rib cage.

## 2017-09-18 NOTE — Discharge Instructions (Signed)
Follow up with your primary care provider for recheck. Return to ER for any worsening or concerning symptoms. Apply lidocaine patch to area for pain relief.
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
## 2017-10-03 ENCOUNTER — Emergency Department (HOSPITAL_COMMUNITY): Payer: Self-pay

## 2017-10-03 ENCOUNTER — Emergency Department (HOSPITAL_COMMUNITY)
Admission: EM | Admit: 2017-10-03 | Discharge: 2017-10-03 | Disposition: A | Discharge status: Home / Self Care | Payer: Self-pay | Attending: Emergency Medicine | Admitting: Emergency Medicine

## 2017-10-03 ENCOUNTER — Encounter (HOSPITAL_COMMUNITY): Payer: Self-pay | Admitting: Student

## 2017-10-03 DIAGNOSIS — F1120 Opioid dependence, uncomplicated: Secondary | ICD-10-CM | POA: Insufficient documentation

## 2017-10-03 DIAGNOSIS — I10 Essential (primary) hypertension: Secondary | ICD-10-CM | POA: Insufficient documentation

## 2017-10-03 DIAGNOSIS — E119 Type 2 diabetes mellitus without complications: Secondary | ICD-10-CM | POA: Insufficient documentation

## 2017-10-03 DIAGNOSIS — R55 Syncope and collapse: Secondary | ICD-10-CM | POA: Insufficient documentation

## 2017-10-03 DIAGNOSIS — I712 Thoracic aortic aneurysm, without rupture: Secondary | ICD-10-CM | POA: Insufficient documentation

## 2017-10-03 DIAGNOSIS — Z79899 Other long term (current) drug therapy: Secondary | ICD-10-CM | POA: Insufficient documentation

## 2017-10-03 DIAGNOSIS — Z7984 Long term (current) use of oral hypoglycemic drugs: Secondary | ICD-10-CM | POA: Insufficient documentation

## 2017-10-03 DIAGNOSIS — F17210 Nicotine dependence, cigarettes, uncomplicated: Secondary | ICD-10-CM | POA: Insufficient documentation

## 2017-10-03 DIAGNOSIS — R7989 Other specified abnormal findings of blood chemistry: Secondary | ICD-10-CM | POA: Insufficient documentation

## 2017-10-03 DIAGNOSIS — I7121 Aneurysm of the ascending aorta, without rupture: Secondary | ICD-10-CM

## 2017-10-03 LAB — CBC WITH DIFFERENTIAL/PLATELET
ABS IMMATURE GRANULOCYTES: 0 10*3/uL (ref 0.0–0.1)
BASOS PCT: 1 %
Basophils Absolute: 0 10*3/uL (ref 0.0–0.1)
EOS ABS: 0 10*3/uL (ref 0.0–0.7)
Eosinophils Relative: 1 %
HCT: 50.3 % (ref 39.0–52.0)
Hemoglobin: 16.1 g/dL (ref 13.0–17.0)
IMMATURE GRANULOCYTES: 0 %
Lymphocytes Relative: 26 %
Lymphs Abs: 1.4 10*3/uL (ref 0.7–4.0)
MCH: 26.8 pg (ref 26.0–34.0)
MCHC: 32 g/dL (ref 30.0–36.0)
MCV: 83.7 fL (ref 78.0–100.0)
Monocytes Absolute: 0.6 10*3/uL (ref 0.1–1.0)
Monocytes Relative: 11 %
NEUTROS ABS: 3.3 10*3/uL (ref 1.7–7.7)
NEUTROS PCT: 61 %
Platelets: 192 10*3/uL (ref 150–400)
RBC: 6.01 MIL/uL — ABNORMAL HIGH (ref 4.22–5.81)
RDW: 14 % (ref 11.5–15.5)
WBC: 5.3 10*3/uL (ref 4.0–10.5)

## 2017-10-03 LAB — COMPREHENSIVE METABOLIC PANEL
ALK PHOS: 62 U/L (ref 38–126)
ALT: 17 U/L (ref 0–44)
ANION GAP: 12 (ref 5–15)
AST: 17 U/L (ref 15–41)
Albumin: 4.2 g/dL (ref 3.5–5.0)
BUN: 10 mg/dL (ref 8–23)
CALCIUM: 9.3 mg/dL (ref 8.9–10.3)
CHLORIDE: 104 mmol/L (ref 98–111)
CO2: 21 mmol/L — ABNORMAL LOW (ref 22–32)
CREATININE: 1.72 mg/dL — AB (ref 0.61–1.24)
GFR calc Af Amer: 48 mL/min — ABNORMAL LOW (ref >60)
GFR, EST NON AFRICAN AMERICAN: 41 mL/min — AB (ref >60)
Glucose, Bld: 214 mg/dL — ABNORMAL HIGH (ref 70–99)
Potassium: 4 mmol/L (ref 3.5–5.1)
Sodium: 137 mmol/L (ref 135–145)
Total Bilirubin: 0.8 mg/dL (ref 0.3–1.2)
Total Protein: 8.1 g/dL (ref 6.5–8.1)

## 2017-10-03 LAB — URINALYSIS, ROUTINE W REFLEX MICROSCOPIC
Bilirubin Urine: NEGATIVE
GLUCOSE, UA: NEGATIVE mg/dL
Hgb urine dipstick: NEGATIVE
KETONES UR: NEGATIVE mg/dL
LEUKOCYTES UA: NEGATIVE
NITRITE: NEGATIVE
PROTEIN: NEGATIVE mg/dL
Specific Gravity, Urine: 1.034 — ABNORMAL HIGH (ref 1.005–1.030)
pH: 6 (ref 5.0–8.0)

## 2017-10-03 LAB — D-DIMER, QUANTITATIVE: D-Dimer, Quant: 0.57 ug/mL-FEU — ABNORMAL HIGH (ref 0.00–0.50)

## 2017-10-03 LAB — I-STAT TROPONIN, ED
TROPONIN I, POC: 0 ng/mL (ref 0.00–0.08)
Troponin i, poc: 0 ng/mL (ref 0.00–0.08)

## 2017-10-03 MED ORDER — SODIUM CHLORIDE 0.9 % IV BOLUS
1000.0000 mL | Freq: Once | INTRAVENOUS | Status: AC
  Administered 2017-10-03: 1000 mL via INTRAVENOUS

## 2017-10-03 MED ORDER — IOPAMIDOL (ISOVUE-370) INJECTION 76%
INTRAVENOUS | Status: AC
  Filled 2017-10-03: qty 100

## 2017-10-03 MED ORDER — IOPAMIDOL (ISOVUE-370) INJECTION 76%
INTRAVENOUS | Status: AC
  Administered 2017-10-03: 20:00:00
  Filled 2017-10-03: qty 100

## 2017-10-03 MED ORDER — IPRATROPIUM-ALBUTEROL 0.5-2.5 (3) MG/3ML IN SOLN
3.0000 mL | Freq: Once | RESPIRATORY_TRACT | Status: AC
  Administered 2017-10-03: 3 mL via RESPIRATORY_TRACT
  Filled 2017-10-03: qty 3

## 2017-10-03 MED ORDER — SODIUM CHLORIDE 0.9 % IV SOLN
INTRAVENOUS | Status: DC
  Administered 2017-10-03: 17:00:00 via INTRAVENOUS

## 2017-10-03 NOTE — ED Notes (Signed)
Pt at desk demanding ice and water, refusing to return to room until provided with such. PA Sam made aware, pt given ice chips and educated as to risk of aspiration should he require surgery. Pt voiced understanding of risk and returned to room eating ice chips.

## 2017-10-03 NOTE — ED Provider Notes (Signed)
[facility] EMERGENCY DEPARTMENT Provider Note   CSN: [id number] Arrival date & time: 10/03/17  1455   History   Chief Complaint Chief Complaint  Patient presents with  . Seizures    HPI Adrian Castillo is a 62 y.o. male with a hx of tobacco abuse, DM, chronic hepatitis C, HTN, and heroin abuse who presents to the ED via EMS s/p LOC which occurred shortly prior to arrival.  Patient states that this afternoon around 12:30 PM that he used an unknown injectable opioid, he states this is only a small amount, but is unsure the exact quantity, he states he has not used opioids and extended period of time.  He states that he was then working outside in the hot sun, he returned inside, was walking around, subsequently sat on the couch and became unresponsive.  Per his friend at bedside patient became unresponsive, his eyes rolled back in the back of his head, and he was somewhat gasping for air.  There was no generalized shaking.  Patient did not fall to the ground.  This episode of unresponsiveness lasted around 5 minutes, and when patient came back to he seemed out of it.  Patient states the last thing he remembers is being overheated and lightheaded.  At present patient's only complaint is his right lateral rib pain which has been ongoing for about 2 weeks, he was evaluated in the emergency department for this, at rest he has no pain, however if he twists turns or someone pushes on that area it is significantly hurts.  It is not worse with a deep breath.  Is not exertional.  Patient currently requiring oxygen, he states that he has intermittent shortness of breath which he relates to his COPD, mildly today, does not require O2 at baseline.  Per patient's friend he is a bit slow to respond but otherwise at baseline. Denies fevers, productive cough, nausea, vomiting, palpitations, leg pain/swelling, hemoptysis, recent surgery/trauma, recent long travel, hormone use, personal hx of cancer, or hx  of DVT/PE.    HPI  Past Medical History:  Diagnosis Date  . Diabetes mellitus without complication ([place])   . Hep C w/o coma, chronic ([place])   . Heroin addiction ([place])   . Hypertension     There are no active problems to display for this patient.   No past surgical history on file.      Home Medications    Prior to Admission medications   Medication Sig Start Date End Date Taking? Authorizing Provider  albuterol (PROVENTIL HFA;VENTOLIN HFA) 108 (90 Base) MCG/ACT inhaler Inhale 2 puffs into the lungs every 4 (four) hours as needed for wheezing or shortness of breath. 03/23/16   Lysbeth Penner, FNP  albuterol (PROVENTIL) (2.5 MG/3ML) 0.083% nebulizer solution Take 2.5 mg by nebulization every 6 (six) hours as needed for shortness of breath or wheezing.    [provider]  amLODipine (NORVASC) 10 MG tablet Take 1 tablet (10 mg total) by mouth daily. 06/15/15   Ward, Ozella Almond, PA-C  benzonatate (TESSALON) 100 MG capsule Take 2 capsules (200 mg total) by mouth 3 (three) times daily as needed for cough. Patient not taking: Reported on 01/27/2017 03/23/16   Lysbeth Penner, FNP  cephALEXin (KEFLEX) 500 MG capsule Take 1 capsule (500 mg total) by mouth 2 (two) times daily. 01/27/17   Law, Bea Graff, PA-C  FLUoxetine (PROZAC) 20 MG capsule Take 20 mg by mouth daily.    [provider]  furosemide (LASIX) 20 MG tablet Take 20 mg by mouth daily.    [provider]  glipiZIDE (GLUCOTROL) 10 MG tablet Take 10 mg by mouth daily.     [provider]  hydrochlorothiazide (HYDRODIURIL) 25 MG tablet Take 1 tablet (25 mg total) by mouth daily. Patient not taking: Reported on 01/27/2017 06/15/15   Ward, Ozella Almond, PA-C  ibuprofen (ADVIL,MOTRIN) 800 MG tablet Take 800-1,600 mg by mouth every 8 (eight) hours as needed (for pain).     [provider]  ketorolac (TORADOL) 10 MG tablet Take 1 tablet (10 mg total) by mouth every 6 (six) hours as needed.  Make sure to take WITH food and do not combine with ibuprofen. 01/27/17   Law, Bea Graff, PA-C  lidocaine (LIDODERM) 5 % Place 1 patch onto the skin daily. Remove & Discard patch within 12 hours or as directed by MD 09/18/17   Tacy Learn, PA-C  METHADONE HCL PO Take 22 mg by mouth daily.    [provider]  naproxen (NAPROSYN) 500 MG tablet Take 500 mg by mouth 2 (two) times daily with a meal.    [provider]  omeprazole (PRILOSEC) 20 MG capsule Take 1 capsule (20 mg total) by mouth daily. Take one tablet daily Patient taking differently: Take 20 mg by mouth daily as needed (for heartburn of reflux symptoms).  04/10/16   Carmin Muskrat, MD  ondansetron (ZOFRAN) 4 MG tablet Take 1 tablet (4 mg total) by mouth every 6 (six) hours. 01/27/17   Law, Bea Graff, PA-C  potassium chloride SA (K-DUR,KLOR-CON) 20 MEQ tablet Take 2 tablets (40 mEq total) by mouth 2 (two) times daily. Patient taking differently: Take 20 mEq by mouth daily as needed (for leg cramps).  04/10/16 01/27/17  Long, Wonda Olds, MD  SUBOXONE 4-1 MG FILM Place 1 Film under the tongue See admin instructions. 1 film in the evening as needed/as directed 12/21/16   [provider]  SUBOXONE 8-2 MG FILM Place 1 Film under the tongue See admin instructions. 1 film in the morning as needed/as directed 12/21/16   [provider]    Family History No family history on file.  Social History Social History   Tobacco Use  . Smoking status: Current Every Day Smoker    Packs/day: 0.50    Types: Cigarettes  . Smokeless tobacco: Never Used  Substance Use Topics  . Alcohol use: No  . Drug use: No    Types: IV    Comment: HEROIN     Allergies   Crab [shellfish allergy] and Tylenol [acetaminophen]   Review of Systems Review of Systems  Constitutional: Negative for chills and fever.  Respiratory: Positive for shortness of breath. Negative for cough.   Cardiovascular: Negative for palpitations  and leg swelling.  Gastrointestinal: Negative for abdominal pain, constipation, diarrhea, nausea and vomiting.  Musculoskeletal:       Positive for right lateral rib pain.  Neurological: Positive for light-headedness. Negative for dizziness, weakness, numbness and headaches.       Positive for period of unresponsiveness. Negative for generalized shaking/seizure activity.   All other systems reviewed and are negative.   Physical Exam Updated Vital Signs BP (!) 150/103 (BP Location: Right Arm)   Pulse 60   Resp 18   SpO2 96%   Physical Exam  Constitutional: He appears well-developed and well-nourished.  Non-toxic appearance. No distress.  HENT:  Head: Normocephalic and atraumatic.  Eyes: Conjunctivae are normal. Right eye exhibits  no discharge. Left eye exhibits no discharge.  Neck: Neck supple.  Cardiovascular: Regular rhythm. Tachycardia present.  No murmur heard. Pulses:      Radial pulses are 2+ on the right side, and 2+ on the left side.       Dorsalis pedis pulses are 2+ on the right side, and 2+ on the left side.  Pulmonary/Chest: He has wheezes (mild exp diffuse). He has no rhonchi. He has no rales. He exhibits tenderness (l right lateral chest wall without overlying abnormality or palpable crepitus.).  Respiration even and unlabored. Patient requiring O2 via [place], resting comfortably on oxygen, initial evaluation in hall bed, will trial RA once in formal ER room.   Abdominal: Soft. He exhibits no distension and no mass. There is no tenderness. There is no rebound and no guarding.  Musculoskeletal: He exhibits no edema or tenderness.  Neurological: He is alert.  Alert. Clear speech, she is somewhat slow to respond to some questions.. No facial droop. CNIII-XII grossly intact. Bilateral upper and lower extremities' sensation grossly intact. 5/5 symmetric strength with grip strength and with plantar and dorsi flexion bilaterally. Normal finger to nose bilaterally. Negative pronator  drift. Gait intact.    Skin: Skin is warm and dry. Capillary refill takes less than 2 seconds. No rash noted.  Psychiatric: He has a normal mood and affect. His behavior is normal.  Nursing note and vitals reviewed.    ED Treatments / Results  Labs (all labs ordered are listed, but only abnormal results are displayed) Labs Reviewed  CBC WITH DIFFERENTIAL/PLATELET - Abnormal; Notable for the following components:      Result Value   RBC 6.01 (*)    All other components within normal limits  URINALYSIS, ROUTINE W REFLEX MICROSCOPIC - Abnormal; Notable for the following components:   APPearance HAZY (*)    Specific Gravity, Urine 1.034 (*)    All other components within normal limits  D-DIMER, QUANTITATIVE (NOT AT [facility]) - Abnormal; Notable for the following components:   D-Dimer, Quant 0.57 (*)    All other components within normal limits  COMPREHENSIVE METABOLIC PANEL - Abnormal; Notable for the following components:   CO2 21 (*)    Glucose, Bld 214 (*)    Creatinine, Ser 1.72 (*)    GFR calc non Af Amer 41 (*)    GFR calc Af Amer 48 (*)    All other components within normal limits  CBG MONITORING, ED  I-STAT TROPONIN, ED  I-STAT TROPONIN, ED    EKG EKG Interpretation  Date/Time:  Tuesday October 03 2017 16:09:01 EDT Ventricular Rate:  99 PR Interval:    QRS Duration: 89 QT Interval:  339 QTC Calculation: 435 R Axis:   126 Text Interpretation:  Sinus rhythm Right axis deviation No significant change was found Confirmed by Jola Schmidt [phone]) on 10/04/2017 6:35:17 PM   Radiology Ct Head Wo Contrast  Result Date: 10/03/2017 CLINICAL DATA:  Episode of syncope and unresponsiveness. EXAM: CT HEAD WITHOUT CONTRAST TECHNIQUE: Contiguous axial images were obtained from the base of the skull through the vertex without intravenous contrast. COMPARISON:  None. FINDINGS: Brain: No evidence of acute infarction, hemorrhage, hydrocephalus, extra-axial collection or mass lesion/mass  effect. Vascular: Atherosclerotic vascular calcification of the carotid siphons. No hyperdense vessel. Skull: Normal. Negative for fracture or focal lesion. Sinuses/Orbits: No acute finding. Other: None. IMPRESSION: Normal noncontrast head CT. Electronically Signed   By: Titus Dubin M.D.   On: 10/03/2017 20:08   Ct Angio  Chest Pe W/cm &/or Wo Cm  Result Date: 10/03/2017 CLINICAL DATA:  Shortness of breath and syncope. EXAM: CT ANGIOGRAPHY CHEST WITH CONTRAST TECHNIQUE: Multidetector CT imaging of the chest was performed using the standard protocol during bolus administration of intravenous contrast. Multiplanar CT image reconstructions and MIPs were obtained to evaluate the vascular anatomy. CONTRAST:  <See Chart> ISOVUE-370 IOPAMIDOL (ISOVUE-370) INJECTION 76% COMPARISON:  Chest x-ray from same day. FINDINGS: Cardiovascular: Satisfactory opacification of the pulmonary arteries to the lobar level. No evidence of central or lobar pulmonary embolism. The segmental pulmonary arteries are not well evaluated. Borderline cardiomegaly. No pericardial effusion. Mild aneurysmal dilatation of the ascending thoracic aorta, measuring 4.0 cm. Mild descending thoracic aortic atherosclerosis. Mediastinum/Nodes: No enlarged mediastinal, hilar, or axillary lymph nodes. Slightly patulous, fluid-filled esophagus. The thyroid gland and trachea demonstrate no significant findings. Lungs/Pleura: Mild lower lobe subsegmental atelectasis. No focal consolidation, pleural effusion, or pneumothorax. No suspicious pulmonary nodule. Upper Abdomen: No acute abnormality.  Distended stomach. Musculoskeletal: No chest wall abnormality. No acute or significant osseous findings. Review of the MIP images confirms the above findings. IMPRESSION: 1. No central or lobar pulmonary embolism. Suboptimal opacification of the segmental pulmonary arteries. 2. Ascending thoracic aortic aneurysm, measuring 4.0 cm. Recommend annual imaging followup by CTA  or MRA. This recommendation follows 2010 ACCF/AHA/AATS/ACR/ASA/SCA/SCAI/SIR/STS/SVM Guidelines for the Diagnosis and Management of Patients with Thoracic Aortic Disease. Circulation. 2010; 121: I458-K998 3.  Aortic atherosclerosis (ICD10-I70.0). 4. Partially visualized distended stomach with fluid filled esophagus. Electronically Signed   By: Titus Dubin M.D.   On: 10/03/2017 20:34   Dg Chest Port 1 View  Result Date: 10/03/2017 CLINICAL DATA:  Loss of consciousness. EXAM: PORTABLE CHEST 1 VIEW COMPARISON:  Chest x-ray dated April 10, 2016. FINDINGS: The heart is at the upper limits of normal in size, likely accentuated by technique. Normal mediastinal contours. Normal pulmonary vascularity. Unchanged left basilar scarring. No focal consolidation, pleural effusion, or pneumothorax. No acute osseous abnormality. IMPRESSION: No active disease. Electronically Signed   By: Titus Dubin M.D.   On: 10/03/2017 15:56    Procedures Procedures (including critical care time)  Medications Ordered in ED Medications  sodium chloride 0.9 % bolus 1,000 mL (0 mLs Intravenous Stopped 10/03/17 1740)    And  0.9 %  sodium chloride infusion ( Intravenous New Bag/Given 10/03/17 1703)  iopamidol (ISOVUE-370) 76 % injection (has no administration in time range)  ipratropium-albuterol (DUONEB) 0.5-2.5 (3) MG/3ML nebulizer solution 3 mL (3 mLs Nebulization Given 10/03/17 1541)  iopamidol (ISOVUE-370) 76 % injection (  Contrast Given 10/03/17 2000)     Initial Impression / Assessment and Plan / ED Course  I have reviewed the triage vital signs and the nursing notes.  Pertinent labs & imaging results that were available during my care of the patient were reviewed by me and considered in my medical decision making (see chart for details).   Patient presents to the emergency department status post period of unresponsiveness after IVDU with opioids and working outside in the heat, on arrival he is tachycardic and  hypoxic requiring new O2.  Exam is otherwise fairly benign, he does have tenderness to palpation of the right lateral chest wall without overlying deformity or palpable crepitus.  Will initiate work-up with CT head, chest x-ray, EKG, and labs to include d-dimer and troponin.  Patient's labs have been reviewed, creatinine 1.72 was elevated from prior on record, last on chart review is 1.36, urinalysis is consistent with dehydration, suspect this is the cause  of patient's bump in creatinine, he is receiving fluids in the ER and I feel the patient benefit from PCP recheck for this.  No evidence of infection on UA.  Hyperglycemia at 214.  No anemia.  No leukocytosis.  No significant electrolyte abnormalities.  Chest x-ray negative for infiltrate, effusion, pneumothorax, or bony abnormality.  Head CT negative.   D-dimer elevated, CTA for PE subsequently obtained, no central or lobar pulmonary embolism.  CTA does reveal ascending thoracic aortic aneurysm measuring 4.0 cm, recommendation for annual follow-up, will provide CT surgery information for the patient.  Heart score 3, ekg without obvious ischemia, delta troponin negative doubt ACS.   On clinical reassessment patient is feeling much improved, he is no longer requiring oxygen, he is ambulatory with SPO2 remaining greater than 95% on RA, his minimal wheezing is resolved, tachycardia has resolved after fluids.  No significant arrhythmias on cardiac monitor.  Based on history, exam, and work-up in the ER I suspect that today's episode of unresponsiveness was likely a syncopal episode as opposed to a seizure, I suspect this was multifactorial given opioid use, dehydration, and being overheated.  Patient appears hemodynamically stable and safe for discharge home with follow-up with primary care as well as CT surgery. I discussed results, treatment plan, need for follow-up, and return precautions with the patient. Provided opportunity for questions, patient  confirmed understanding and is in agreement with plan.   Findings and plan of care discussed with supervising physician Dr. Roderic Palau who personally evaluated and examined this patient and is in agreement.    Final Clinical Impressions(s) / ED Diagnoses   Final diagnoses:  Syncope, unspecified syncope type  Elevated serum creatinine  Thoracic ascending aortic aneurysm [facility])    ED Discharge Orders    None       Amaryllis Dyke, PA-C 10/05/17 Serita Butcher, MD 10/05/17 1234

## 2017-10-03 NOTE — ED Triage Notes (Signed)
Pt here from home via [facility] after becoming unresponsive this afternoon sitting on the couch. Witnesses say patient "clinched up and went unresponsive suddenly." No seizure activity for EMS. Per EMS, pt was post ictal and diaphoretic. No seizure hx. Denies drug use. HR 122 for EMS. 89% on RA, 95% on 3L O2 for EMS. Pt responsive and A/Ox4 at this time.

## 2017-10-03 NOTE — ED Notes (Signed)
Attempted to obtain urine sample, pt still not able to provide urine sample at this time , urinal at bedside

## 2017-10-03 NOTE — Discharge Instructions (Addendum)
You are seen in the emergency department today for an episode of unresponsiveness.  Overall your work-up was reassuring.  Your CT scan does show that you have a thoracic aortic aneurysm that is formed centimeters in size, this will require yearly imaging, it is important that you discuss it with your primary care provider and follow-up with the cardiothoracic surgeon provided in your discharge instructions.  Your creatinine was elevated today at 1.72, this is a measure of kidney function, this is likely because you were dehydrated, please be sure to drink plenty of water and have this rechecked by her primary care provider within 1 week.   Please follow-up with your primary care provider as well as cardio thoracic surgeon your discharge instructions.  Return to the ER anytime for new or worsening symptoms including but limited to chest pain, shortness of breath, passing out again, seizure activity, or any other concerns.

## 2017-10-03 NOTE — ED Notes (Signed)
ED Provider at bedside. 

## 2018-01-16 IMAGING — DX DG CHEST 2V
2 series · 2 of 2 positions shown · non-contrast
Comparison: 11/22/2014

CLINICAL DATA: Central chest pain.

EXAM:
CHEST  2 VIEW

[chest lat]
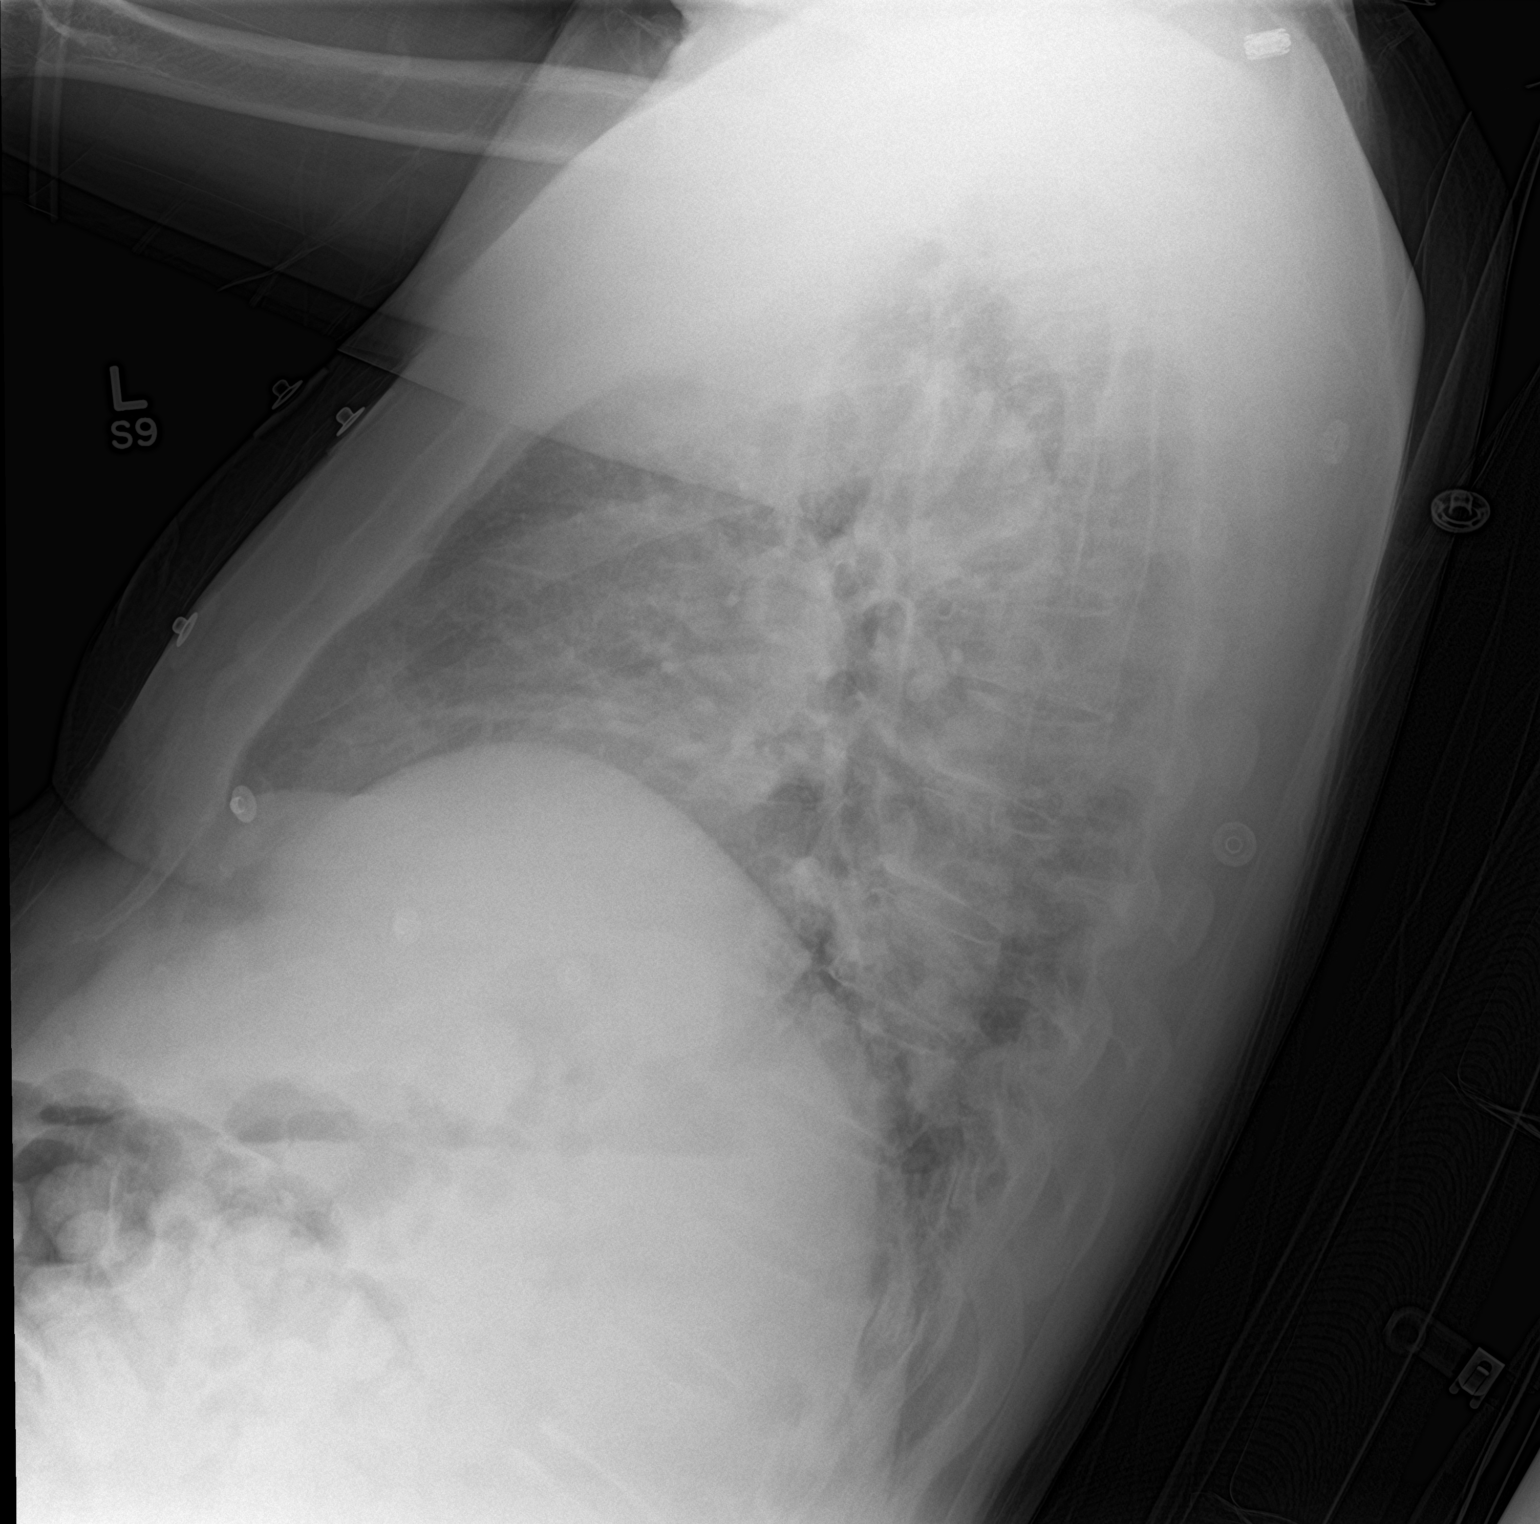

[chest ap]
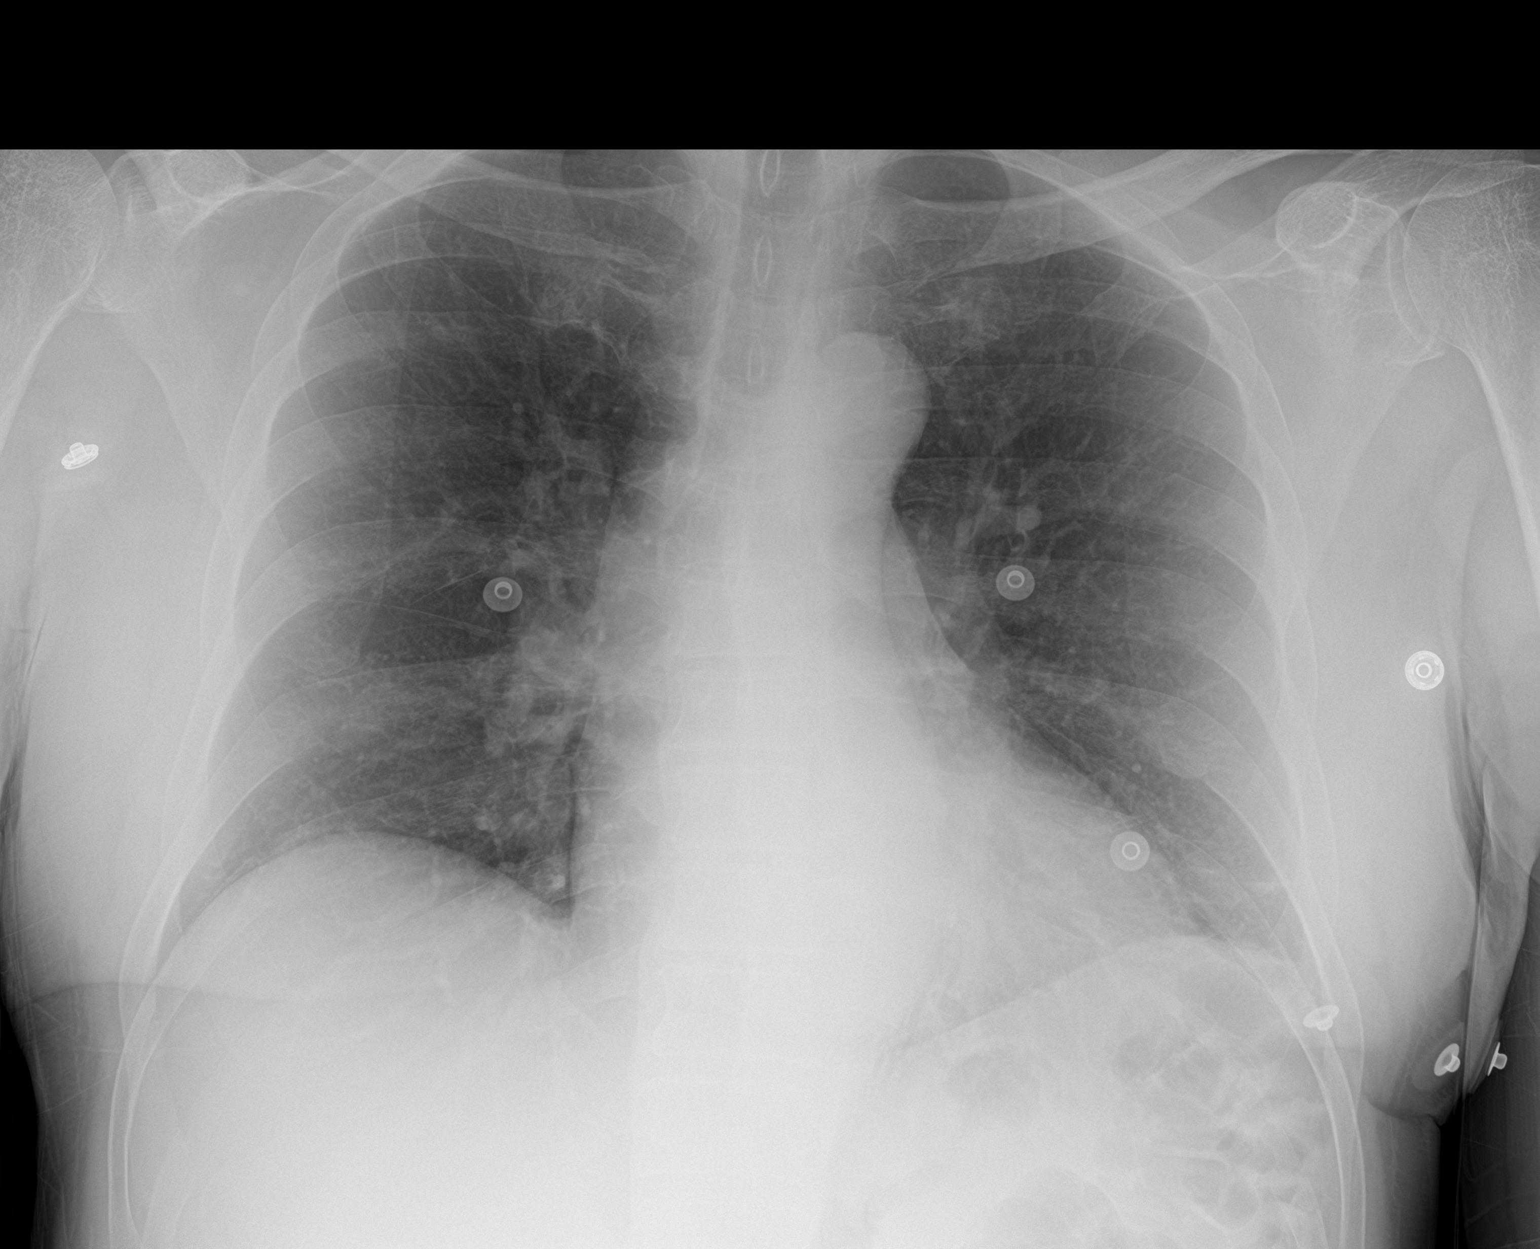

[2 of 2 positions shown; findings below may reference images not displayed]

FINDINGS: Normal heart size and stable mild aortic tortuosity. There is no
edema, consolidation, effusion, or pneumothorax. Minor scarring at
the peripheral left base. No acute osseous finding.
IMPRESSION: No evidence of active disease.

## 2019-07-11 IMAGING — DX DG CHEST 1V PORT
1 series · 1 of 1 positions shown · non-contrast
Comparison: Chest x-ray dated April 10, 2016.

CLINICAL DATA: Loss of consciousness.

EXAM:
PORTABLE CHEST 1 VIEW

[chest ap]
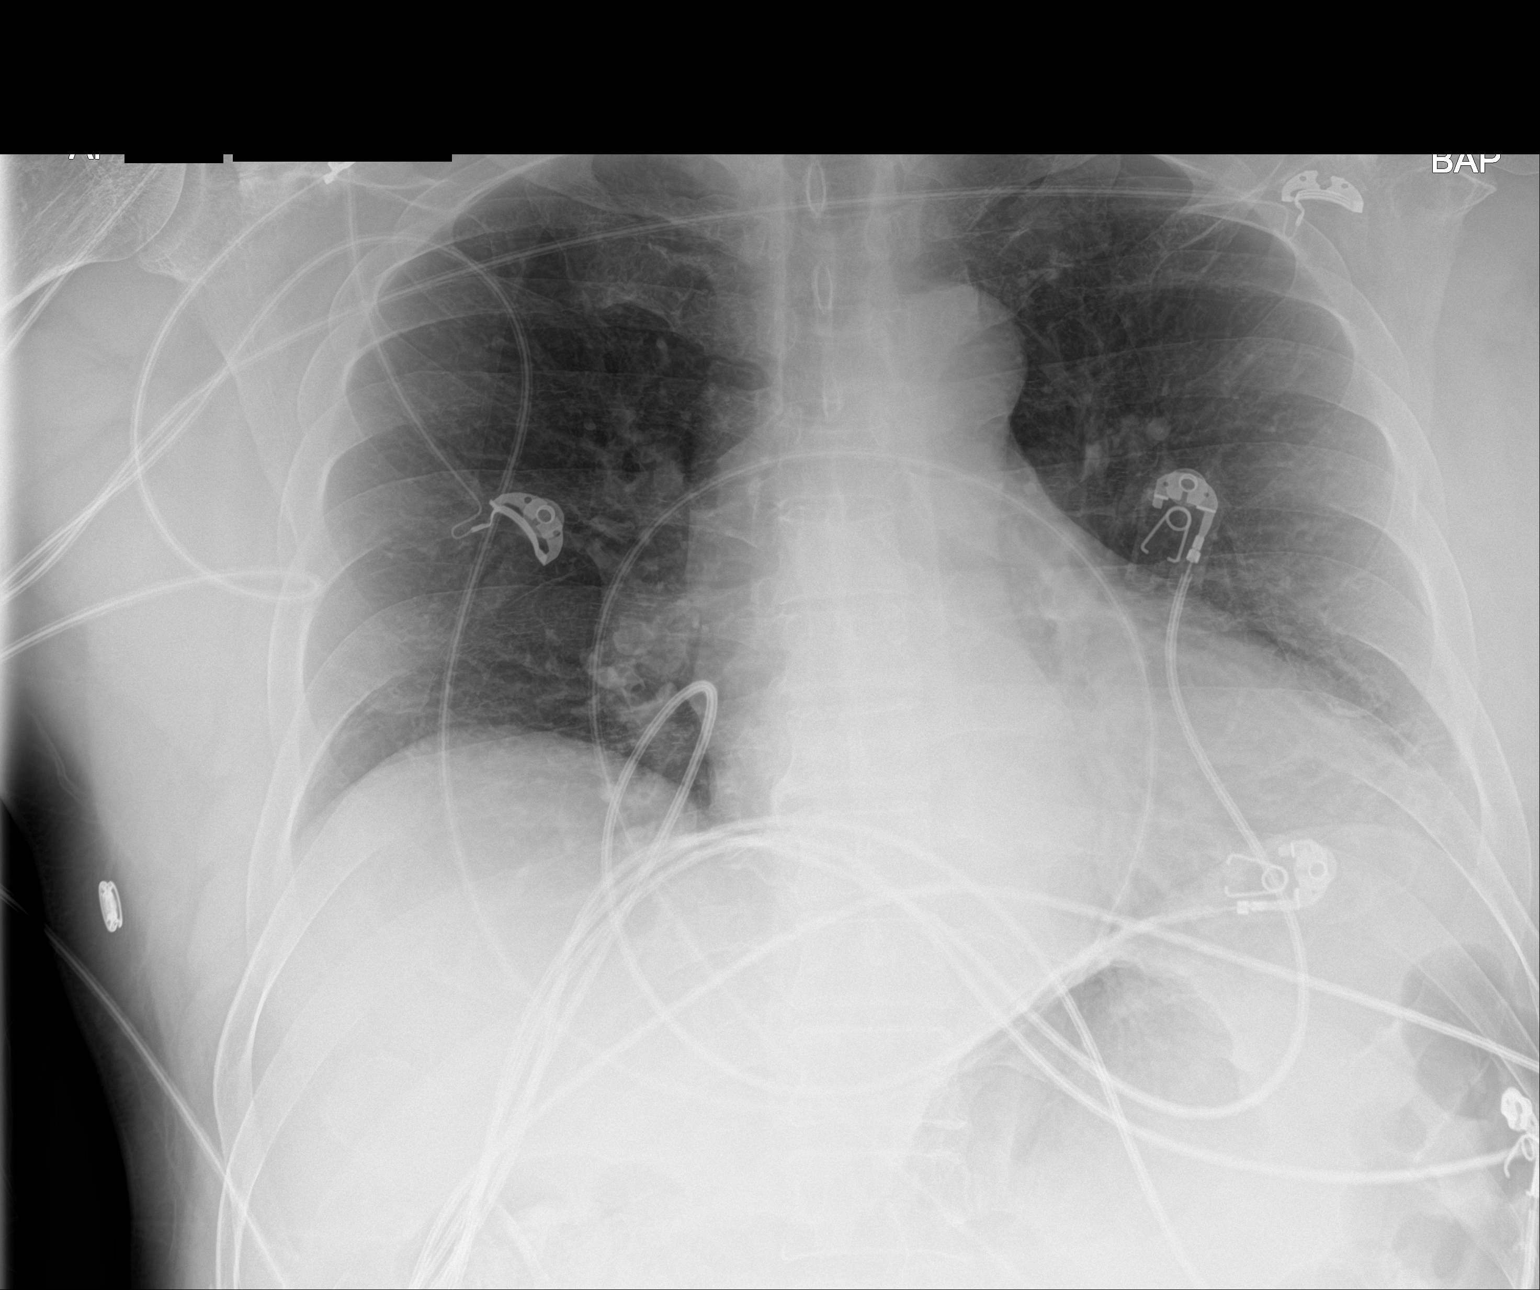

[1 of 1 positions shown; findings below may reference images not displayed]

FINDINGS: The heart is at the upper limits of normal in size, likely
accentuated by technique. Normal mediastinal contours. Normal
pulmonary vascularity. Unchanged left basilar scarring. No focal
consolidation, pleural effusion, or pneumothorax. No acute osseous
abnormality.
IMPRESSION: No active disease.

## 2019-07-11 IMAGING — CT CT ANGIO CHEST
1 series · 1 of 1 positions shown · IV contrast (iopamidol)
Comparison: Chest x-ray from same day.

CLINICAL DATA: Shortness of breath and syncope.

EXAM:
CT ANGIOGRAPHY CHEST WITH CONTRAST
TECHNIQUE: Multidetector CT imaging of the chest was performed using the
standard protocol during bolus administration of intravenous
contrast. Multiplanar CT image reconstructions and MIPs were
obtained to evaluate the vascular anatomy.
CONTRAST:  <See Chart> 03ECV1-CFR IOPAMIDOL (03ECV1-CFR) INJECTION
76%

[Series 2: topogram 0.6 t20f · sagittal · 1.00mm/px · 1 of 1 slices shown]
[im 1/1]
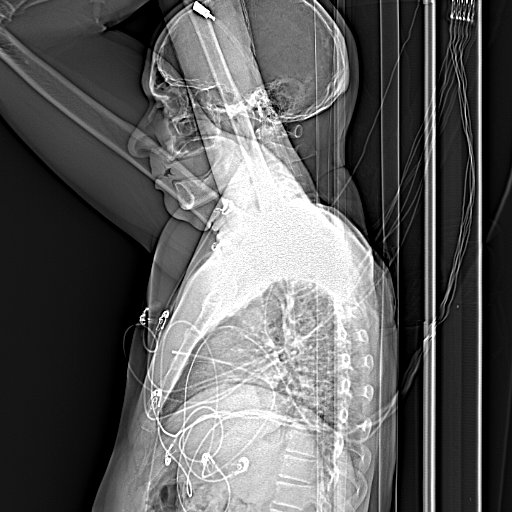

[1 of 1 positions shown; findings below may reference images not displayed]

FINDINGS: Cardiovascular: Satisfactory opacification of the pulmonary arteries
to the lobar level. No evidence of central or lobar pulmonary
embolism. The segmental pulmonary arteries are not well evaluated.
Borderline cardiomegaly. No pericardial effusion. Mild aneurysmal
dilatation of the ascending thoracic aorta, measuring 4.0 cm. Mild
descending thoracic aortic atherosclerosis.

Mediastinum/Nodes: No enlarged mediastinal, hilar, or axillary lymph
nodes. Slightly patulous, fluid-filled esophagus. The thyroid gland
and trachea demonstrate no significant findings.

Lungs/Pleura: Mild lower lobe subsegmental atelectasis. No focal
consolidation, pleural effusion, or pneumothorax. No suspicious
pulmonary nodule.

Upper Abdomen: No acute abnormality.  Distended stomach.

Musculoskeletal: No chest wall abnormality. No acute or significant
osseous findings.

Review of the MIP images confirms the above findings.
IMPRESSION: 1. No central or lobar pulmonary embolism. Suboptimal opacification
of the segmental pulmonary arteries.
2. Ascending thoracic aortic aneurysm, measuring 4.0 cm. Recommend
annual imaging followup by CTA or MRA. This recommendation follows
1131 ACCF/AHA/AATS/ACR/ASA/SCA/NARJESSE/ANSELMO JOSE/DADI/FERIENHAUS Guidelines for the
Diagnosis and Management of Patients with Thoracic Aortic Disease.
Circulation. 1131; 121: [id number]
3.  Aortic atherosclerosis ([id number]).
4. Partially visualized distended stomach with fluid filled
esophagus.

## 2020-03-31 ENCOUNTER — Emergency Department (HOSPITAL_COMMUNITY)
Admission: EM | Admit: 2020-03-31 | Discharge: 2020-03-31 | Disposition: A | Discharge status: Home / Self Care | Payer: Self-pay | Attending: Emergency Medicine | Admitting: Emergency Medicine

## 2020-03-31 ENCOUNTER — Encounter (HOSPITAL_COMMUNITY): Payer: Self-pay

## 2020-03-31 ENCOUNTER — Other Ambulatory Visit: Payer: Self-pay | Admitting: Physician Assistant

## 2020-03-31 DIAGNOSIS — I10 Essential (primary) hypertension: Secondary | ICD-10-CM | POA: Insufficient documentation

## 2020-03-31 DIAGNOSIS — Z79899 Other long term (current) drug therapy: Secondary | ICD-10-CM | POA: Insufficient documentation

## 2020-03-31 DIAGNOSIS — T401X1A Poisoning by heroin, accidental (unintentional), initial encounter: Secondary | ICD-10-CM | POA: Insufficient documentation

## 2020-03-31 DIAGNOSIS — Z7984 Long term (current) use of oral hypoglycemic drugs: Secondary | ICD-10-CM | POA: Insufficient documentation

## 2020-03-31 DIAGNOSIS — E119 Type 2 diabetes mellitus without complications: Secondary | ICD-10-CM | POA: Insufficient documentation

## 2020-03-31 DIAGNOSIS — F17210 Nicotine dependence, cigarettes, uncomplicated: Secondary | ICD-10-CM | POA: Insufficient documentation

## 2020-03-31 DIAGNOSIS — Z760 Encounter for issue of repeat prescription: Secondary | ICD-10-CM

## 2020-03-31 MED ORDER — AMLODIPINE BESYLATE 10 MG PO TABS: 10.0000 mg | ORAL_TABLET | Freq: Every day | ORAL | 0 refills | Status: DC

## 2020-03-31 MED ORDER — NALOXONE HCL 4 MG/0.1ML NA LIQD: NASAL | 0 refills | Status: AC

## 2020-03-31 MED FILL — NARCAN 4 MG NASAL SPRAY: 4 | 2 days supply | Qty: 2 | Fill #0

## 2020-03-31 NOTE — Discharge Instructions (Signed)
Please fill the prescription for narcan.    A peer support specialist should reach out to you regarding your substance use  You were given information for substance use treatment   Please follow up with your primary care provider within 5-7 days for re-evaluation of your symptoms. If you do not have a primary care provider, information for a healthcare clinic has been provided for you to make arrangements for follow up care. Please return to the emergency department for any new or worsening symptoms.

## 2020-03-31 NOTE — ED Provider Notes (Signed)
[facility] EMERGENCY DEPARTMENT Provider Note   CSN: [id number] Arrival date & time: 03/31/20  1534     History Chief Complaint  Patient presents with  . Drug Overdose    Adrian Castillo is a 65 y.o. male.  HPI   65 year old male with a history of diabetes, hep C, heroin addiction, hypertension, who presents the emergency department today for evaluation of an overdose.  Patient states that he snorted some heroin and was walking in the street.  The next thing he knew EMS was around him.  EMS reports that they were called out as patient was found unconscious on the street and would only respond to sternal rub.  They gave him Narcan and he immediately became alert and oriented.  Patient denies any other drug use today.  Denies alcohol use.  He denies any headaches, vomiting, neuro complaints or areas of pain. Denies SI or intent to self harm.  Past Medical History:  Diagnosis Date  . Diabetes mellitus without complication ([place])   . Hep C w/o coma, chronic ([place])   . Heroin addiction ([place])   . Hypertension     There are no problems to display for this patient.   History reviewed. No pertinent surgical history.     History reviewed. No pertinent family history.  Social History   Tobacco Use  . Smoking status: Current Every Day Smoker    Packs/day: 0.50    Types: Cigarettes  . Smokeless tobacco: Never Used  Substance Use Topics  . Alcohol use: No  . Drug use: No    Types: IV    Comment: HEROIN    Home Medications Prior to Admission medications   Medication Sig Start Date End Date Taking? Authorizing Provider  amLODipine (NORVASC) 10 MG tablet Take 1 tablet (10 mg total) by mouth daily. 03/31/20  Yes Kaidan Spengler S, PA-C  naloxone (NARCAN) nasal spray 4 mg/0.1 mL Use one spray in the nostril in the case of an overdose 03/31/20  Yes Nyelah Emmerich S, PA-C  albuterol (PROVENTIL HFA;VENTOLIN HFA) 108 (90 Base) MCG/ACT inhaler Inhale 2 puffs into the lungs  every 4 (four) hours as needed for wheezing or shortness of breath. 03/23/16   Lysbeth Penner, FNP  glipiZIDE (GLUCOTROL) 10 MG tablet Take 10 mg by mouth daily.     [provider]  hydrochlorothiazide (HYDRODIURIL) 25 MG tablet Take 1 tablet (25 mg total) by mouth daily. 06/15/15   Ward, Ozella Almond, PA-C  ibuprofen (ADVIL,MOTRIN) 800 MG tablet Take 800-1,600 mg by mouth every 8 (eight) hours as needed (for pain).     [provider]  naproxen (NAPROSYN) 500 MG tablet Take 500 mg by mouth 2 (two) times daily with a meal.    [provider]  potassium chloride SA (K-DUR,KLOR-CON) 20 MEQ tablet Take 2 tablets (40 mEq total) by mouth 2 (two) times daily. Patient taking differently: Take 20 mEq by mouth daily as needed (for leg cramps).  04/10/16 10/03/17  Long, Wonda Olds, MD    Allergies    Otho Darner allergy] and Tylenol [acetaminophen]  Review of Systems   Review of Systems  Constitutional: Negative for fever.  HENT: Negative for ear pain and sore throat.   Eyes: Negative for visual disturbance.  Respiratory: Negative for cough and shortness of breath.   Cardiovascular: Negative for chest pain.  Gastrointestinal: Negative for abdominal pain, constipation, diarrhea, nausea and vomiting.  Genitourinary: Negative for dysuria and hematuria.  Musculoskeletal: Negative for back  pain and neck pain.  Skin: Negative for rash.  Neurological: Negative for dizziness, light-headedness, numbness and headaches.  All other systems reviewed and are negative.   Physical Exam Updated Vital Signs BP (!) 146/92 (BP Location: Left Arm)   Pulse 74   Temp 97.8 F (36.6 C) (Oral)   Resp 13   SpO2 96%   Physical Exam Vitals and nursing note reviewed.  Constitutional:      Appearance: He is well-developed and well-nourished.  HENT:     Head: Normocephalic and atraumatic.  Eyes:     Conjunctiva/sclera: Conjunctivae normal.  Cardiovascular:     Rate and Rhythm: Normal  rate and regular rhythm.     Heart sounds: Normal heart sounds. No murmur heard.   Pulmonary:     Effort: Pulmonary effort is normal. No respiratory distress.     Breath sounds: Normal breath sounds. No wheezing, rhonchi or rales.  Abdominal:     General: Bowel sounds are normal.     Palpations: Abdomen is soft.     Tenderness: There is no abdominal tenderness. There is no guarding or rebound.  Musculoskeletal:        General: No edema.     Cervical back: Neck supple.  Skin:    General: Skin is warm and dry.  Neurological:     Mental Status: He is alert.     Comments: Mental Status:  Alert, thought content appropriate, able to give a coherent history. Speech fluent without evidence of aphasia. Able to follow 2 step commands without difficulty.  Cranial Nerves:  II:  pupils equal, round, reactive to light III,IV, VI: ptosis not present, extra-ocular motions intact bilaterally  V,VII: smile symmetric, facial light touch sensation equal VIII: hearing grossly normal to voice  X: uvula elevates symmetrically  XI: bilateral shoulder shrug symmetric and strong XII: midline tongue extension without fassiculations Motor:  Normal tone. 5/5 strength of BUE and BLE major muscle groups including strong and equal grip strength and dorsiflexion/plantar flexion Sensory: light touch normal in all extremities.   Psychiatric:        Mood and Affect: Mood and affect normal.     ED Results / Procedures / Treatments   Labs (all labs ordered are listed, but only abnormal results are displayed) Labs Reviewed - No data to display  EKG EKG Interpretation  Date/Time:  Tuesday March 31 2020 15:55:15 EST Ventricular Rate:  77 PR Interval:    QRS Duration: 97 QT Interval:  430 QTC Calculation: 487 R Axis:   25 Text Interpretation: Sinus rhythm Borderline prolonged QT interval Artifact Abnormal ECG Confirmed by Carmin Muskrat [phone]) on 03/31/2020 3:56:34 PM   Radiology No results  found.  Procedures Procedures   Medications Ordered in ED Medications - No data to display  ED Course  I have reviewed the triage vital signs and the nursing notes.  Pertinent labs & imaging results that were available during my care of the patient were reviewed by me and considered in my medical decision making (see chart for details).    MDM Rules/Calculators/A&P                          65 y/o M presenting for eval after an overdose on heroin pta. Neuro exam at baseline following narcan. No obvious injuries on exam.  EKG Sinus rhythm Borderline prolonged QT interval Artifact Abnormal ECG   Monitored in the department for several hours and on reassessment he remains  at mental baseline. Counseled on drug cessation. Consult to peer support place. rx for narcan given for home. He also requested refill of his bp meds. rx filled and pcp referral given. No evidence of htn emergency today. Advised on f/u and return precautions. He voices understanding of the plan and reasons to return. All questions answered, pt stable for discharge   Final Clinical Impression(s) / ED Diagnoses Final diagnoses:  Accidental overdose of heroin, initial encounter [facility])  Medication refill    Rx / DC Orders ED Discharge Orders         Ordered    naloxone (NARCAN) nasal spray 4 mg/0.1 mL        03/31/20 1653    Consult to Peer Support       Provider:  (Not yet assigned)   03/31/20 1653    amLODipine (NORVASC) 10 MG tablet  Daily        03/31/20 2020           Rodney Booze, PA-C 03/31/20 2021    Carmin Muskrat, MD 04/06/20 1115

## 2020-03-31 NOTE — ED Notes (Signed)
Pt appears sleepy, pupils are 3 and reactive and pt is oriented.

## 2020-03-31 NOTE — ED Triage Notes (Addendum)
Pt was seen unconscious, bystander called 911. EMS reports pt was unconscious on the street and would only respond to sternal rub. Pt responded to Narcan & is GCS of 15 upon arrival. Pt reports snorting heroin.

## 2020-04-01 MED FILL — AMLODIPINE BESYLATE 10 MG T: 10 | 30 days supply | Qty: 30 | Fill #0

## 2020-04-29 ENCOUNTER — Other Ambulatory Visit: Payer: Self-pay

## 2020-04-29 ENCOUNTER — Ambulatory Visit: Payer: Medicaid Other | Attending: Physician Assistant | Admitting: Physician Assistant

## 2020-04-29 NOTE — Progress Notes (Deleted)
Patient ID: Adrian Castillo, male   DOB: Nov 19, 1955, 65 y.o.   MRN: [id number]   After ED visit 03/31/2020 for accidental overdose  From ED note: Adrian Castillo is a 65 y.o. male.  HPI   65 year old male with a history of diabetes, hep C, heroin addiction, hypertension, who presents the emergency department today for evaluation of an overdose.  Patient states that he snorted some heroin and was walking in the street.  The next thing he knew EMS was around him.  EMS reports that they were called out as patient was found unconscious on the street and would only respond to sternal rub.  They gave him Narcan and he immediately became alert and oriented.  Patient denies any other drug use today.  Denies alcohol use.  He denies any headaches, vomiting, neuro complaints or areas of pain. Denies SI or intent to self harm.  From A/P: 65 y/o M presenting for eval after an overdose on heroin pta. Neuro exam at baseline following narcan. No obvious injuries on exam.  EKG Sinus rhythm Borderline prolonged QT interval Artifact Abnormal ECG   Monitored in the department for several hours and on reassessment he remains at mental baseline. Counseled on drug cessation. Consult to peer support place. rx for narcan given for home. He also requested refill of his bp meds. rx filled and pcp referral given. No evidence of htn emergency today. Advised on f/u and return precautions. He voices understanding of the plan and reasons to return. All questions answered, pt stable for discharge

## 2020-10-01 ENCOUNTER — Encounter (HOSPITAL_COMMUNITY): Payer: Self-pay | Admitting: Emergency Medicine

## 2020-10-01 ENCOUNTER — Other Ambulatory Visit: Payer: Self-pay

## 2020-10-01 ENCOUNTER — Emergency Department (HOSPITAL_COMMUNITY)
Admission: EM | Admit: 2020-10-01 | Discharge: 2020-10-01 | Disposition: A | Discharge status: Home / Self Care | Payer: Medicare Other | Attending: Emergency Medicine | Admitting: Emergency Medicine

## 2020-10-01 ENCOUNTER — Ambulatory Visit (INDEPENDENT_AMBULATORY_CARE_PROVIDER_SITE_OTHER)
Admission: EM | Admit: 2020-10-01 | Discharge: 2020-10-01 | Disposition: A | Discharge status: Home / Self Care | Payer: Medicare Other | Source: Home / Self Care | Attending: Emergency Medicine | Admitting: Emergency Medicine

## 2020-10-01 DIAGNOSIS — E119 Type 2 diabetes mellitus without complications: Secondary | ICD-10-CM | POA: Diagnosis not present

## 2020-10-01 DIAGNOSIS — L089 Local infection of the skin and subcutaneous tissue, unspecified: Secondary | ICD-10-CM | POA: Diagnosis not present

## 2020-10-01 DIAGNOSIS — R61 Generalized hyperhidrosis: Secondary | ICD-10-CM | POA: Diagnosis not present

## 2020-10-01 DIAGNOSIS — Z79899 Other long term (current) drug therapy: Secondary | ICD-10-CM | POA: Insufficient documentation

## 2020-10-01 DIAGNOSIS — Z5321 Procedure and treatment not carried out due to patient leaving prior to being seen by health care provider: Secondary | ICD-10-CM | POA: Insufficient documentation

## 2020-10-01 DIAGNOSIS — L03113 Cellulitis of right upper limb: Secondary | ICD-10-CM | POA: Insufficient documentation

## 2020-10-01 DIAGNOSIS — F1110 Opioid abuse, uncomplicated: Secondary | ICD-10-CM | POA: Insufficient documentation

## 2020-10-01 DIAGNOSIS — L02419 Cutaneous abscess of limb, unspecified: Secondary | ICD-10-CM | POA: Insufficient documentation

## 2020-10-01 DIAGNOSIS — R791 Abnormal coagulation profile: Secondary | ICD-10-CM | POA: Insufficient documentation

## 2020-10-01 DIAGNOSIS — F1990 Other psychoactive substance use, unspecified, uncomplicated: Secondary | ICD-10-CM

## 2020-10-01 DIAGNOSIS — I10 Essential (primary) hypertension: Secondary | ICD-10-CM | POA: Insufficient documentation

## 2020-10-01 DIAGNOSIS — Z7984 Long term (current) use of oral hypoglycemic drugs: Secondary | ICD-10-CM | POA: Diagnosis not present

## 2020-10-01 DIAGNOSIS — M25532 Pain in left wrist: Secondary | ICD-10-CM | POA: Diagnosis present

## 2020-10-01 DIAGNOSIS — L03818 Cellulitis of other sites: Secondary | ICD-10-CM | POA: Insufficient documentation

## 2020-10-01 LAB — COMPREHENSIVE METABOLIC PANEL
ALT: 16 U/L (ref 0–44)
AST: 21 U/L (ref 15–41)
Albumin: 3.7 g/dL (ref 3.5–5.0)
Alkaline Phosphatase: 59 U/L (ref 38–126)
Anion gap: 7 (ref 5–15)
BUN: 8 mg/dL (ref 8–23)
CO2: 27 mmol/L (ref 22–32)
Calcium: 9.1 mg/dL (ref 8.9–10.3)
Chloride: 99 mmol/L (ref 98–111)
Creatinine, Ser: 0.74 mg/dL (ref 0.61–1.24)
GFR, Estimated: >60 mL/min (ref >60)
Glucose, Bld: 138 mg/dL — ABNORMAL HIGH (ref 70–99)
Potassium: 3.6 mmol/L (ref 3.5–5.1)
Sodium: 133 mmol/L — ABNORMAL LOW (ref 135–145)
Total Bilirubin: 0.6 mg/dL (ref 0.3–1.2)
Total Protein: 7.6 g/dL (ref 6.5–8.1)

## 2020-10-01 LAB — CBC WITH DIFFERENTIAL/PLATELET
Abs Immature Granulocytes: 0.02 10*3/uL (ref 0.00–0.07)
Basophils Absolute: 0 10*3/uL (ref 0.0–0.1)
Basophils Relative: 0 %
Eosinophils Absolute: 0.1 10*3/uL (ref 0.0–0.5)
Eosinophils Relative: 1 %
HCT: 41.9 % (ref 39.0–52.0)
Hemoglobin: 13.8 g/dL (ref 13.0–17.0)
Immature Granulocytes: 0 %
Lymphocytes Relative: 23 %
Lymphs Abs: 1.6 10*3/uL (ref 0.7–4.0)
MCH: 26.7 pg (ref 26.0–34.0)
MCHC: 32.9 g/dL (ref 30.0–36.0)
MCV: 81 fL (ref 80.0–100.0)
Monocytes Absolute: 0.7 10*3/uL (ref 0.1–1.0)
Monocytes Relative: 10 %
Neutro Abs: 4.5 10*3/uL (ref 1.7–7.7)
Neutrophils Relative %: 66 %
Platelets: 185 10*3/uL (ref 150–400)
RBC: 5.17 MIL/uL (ref 4.22–5.81)
RDW: 13.6 % (ref 11.5–15.5)
WBC: 6.9 10*3/uL (ref 4.0–10.5)
nRBC: 0 % (ref 0.0–0.2)

## 2020-10-01 LAB — LACTIC ACID, PLASMA
Lactic Acid, Venous: 1.4 mmol/L (ref 0.5–1.9)
Lactic Acid, Venous: 1.8 mmol/L (ref 0.5–1.9)

## 2020-10-01 LAB — APTT: aPTT: 24 seconds (ref 24–36)

## 2020-10-01 LAB — PROTIME-INR
INR: 1 (ref 0.8–1.2)
Prothrombin Time: 13.2 seconds (ref 11.4–15.2)

## 2020-10-01 MED ORDER — VANCOMYCIN HCL 2000 MG/400ML IV SOLN
2000.0000 mg | Freq: Once | INTRAVENOUS | Status: AC
  Administered 2020-10-01: 2000 mg via INTRAVENOUS
  Filled 2020-10-01: qty 400

## 2020-10-01 MED ORDER — SULFAMETHOXAZOLE-TRIMETHOPRIM 800-160 MG PO TABS
1.0000 | ORAL_TABLET | Freq: Two times a day (BID) | ORAL | 0 refills | Status: AC
  Filled 2020-10-01: qty 28, 14d supply, fill #0

## 2020-10-01 NOTE — ED Provider Notes (Signed)
[facility] EMERGENCY DEPARTMENT Provider Note   CSN: [id number] Arrival date & time: 10/01/20  1751     History Chief Complaint  Patient presents with   Wound Infection    Adrian Castillo is a 65 y.o. male with a PMH significant for DM2, HTN, Hep C, IV and subcutaneous drug use who presents with multiple areas of erythematous, swollen, indurated skin on bilateral upper extremities. Patient reports he has been skin popping heroin into multiple areas in both arms. The patient reports his last use was 2 days ago. Patient also endorses IVDU, but last use IV was months ago. Patient has stuck pins in large swollen nodule on distal right ventral forearm as well as left dorsal wrist. Patient reports only blood drainage from site on right arm, but both blood and pus from left wrist. Patient denies chest pain, shortness of breath, fever, body aches, chills. Patient does report diaphoresis consistent with his usual withdrawal. Patient has not taken antipyretic. Patient reports he has difficulty moving his left wrist due to pain and tightness. Patient denies prior history of MRSA, endocarditis, sepsis. Patient was seen earlier today by the [facility] who sent him to the ED due to worry for sepsis, septic joint, and endocarditis.   HPI     Past Medical History:  Diagnosis Date   Diabetes mellitus without complication ([place])    Hep C w/o coma, chronic ([place])    Heroin addiction ([place])    Hypertension     There are no problems to display for this patient.   History reviewed. No pertinent surgical history.     No family history on file.  Social History   Tobacco Use   Smoking status: Every Day    Packs/day: 0.50    Types: Cigarettes   Smokeless tobacco: Never  Substance Use Topics   Alcohol use: No   Drug use: No    Types: IV    Comment: HEROIN    Home Medications Prior to Admission medications   Medication Sig Start Date End Date Taking? Authorizing  Provider  albuterol (PROVENTIL HFA;VENTOLIN HFA) 108 (90 Base) MCG/ACT inhaler Inhale 2 puffs into the lungs every 4 (four) hours as needed for wheezing or shortness of breath. 03/23/16  Yes Lysbeth Penner, FNP  ibuprofen (ADVIL,MOTRIN) 800 MG tablet Take 800-1,600 mg by mouth every 8 (eight) hours as needed (for pain).    Yes [provider]  losartan (COZAAR) 25 MG tablet Take 12.5 mg by mouth daily. 07/13/20  Yes [provider]  naloxone (NARCAN) nasal spray 4 mg/0.1 mL USE ONE SPRAY IN THE NOSTRIL IN THE CASE OF AN OVERDOSE 03/31/20 03/31/21 Yes Couture, Cortni S, PA-C  sulfamethoxazole-trimethoprim (BACTRIM DS) 800-160 MG tablet Take 1 tablet by mouth 2 (two) times daily for 14 days. 10/01/20 10/15/20 Yes Berkeley Vanaken H, PA-C  amLODipine (NORVASC) 10 MG tablet Take 1 tablet (10 mg total) by mouth daily. Patient not taking: Reported on 10/01/2020 03/31/20   Couture, Cortni S, PA-C  amLODipine (NORVASC) 10 MG tablet TAKE 1 TABLET (10 MG TOTAL) BY MOUTH DAILY. Patient not taking: Reported on 10/01/2020 03/31/20 03/31/21  Couture, Cortni S, PA-C  glipiZIDE (GLUCOTROL) 10 MG tablet Take 10 mg by mouth daily.  Patient not taking: Reported on 10/01/2020    [provider]  hydrochlorothiazide (HYDRODIURIL) 25 MG tablet Take 1 tablet (25 mg total) by mouth daily. Patient not taking: Reported on 10/01/2020 06/15/15   Ward, Ozella Almond, PA-C  naloxone (NARCAN) nasal spray 4 mg/0.1 mL Use one spray in the nostril in the case of an overdose Patient not taking: Reported on 10/01/2020 03/31/20   Couture, Cortni S, PA-C  potassium chloride SA (K-DUR,KLOR-CON) 20 MEQ tablet Take 2 tablets (40 mEq total) by mouth 2 (two) times daily. Patient taking differently: Take 20 mEq by mouth daily as needed (for leg cramps).  04/10/16 10/03/17  Long, Wonda Olds, MD    Allergies    Otho Darner allergy] and Tylenol [acetaminophen]  Review of Systems   Review of Systems  Constitutional:  Positive  for diaphoresis. Negative for chills and fever.  Respiratory:  Negative for chest tightness and shortness of breath.   Cardiovascular:  Negative for chest pain and palpitations.  Gastrointestinal:  Negative for abdominal pain.  Genitourinary:  Negative for difficulty urinating.  Musculoskeletal:  Positive for joint swelling. Negative for neck pain and neck stiffness.       Decreased passive and active range of motion of left wrist. No acute pain over pollux flexor tendon sheath. Unable to fully oppose thumb to fifth digit.  Neurological:  Negative for headaches.   Physical Exam Updated Vital Signs BP (!) 144/96 (BP Location: Right Arm)   Pulse 60   Temp 98.1 F (36.7 C) (Oral)   Resp 16   SpO2 95%   Physical Exam Constitutional:      General: He is not in acute distress.    Appearance: Normal appearance. He is not ill-appearing.  HENT:     Head: Normocephalic and atraumatic.  Eyes:     General:        Right eye: No discharge.        Left eye: No discharge.  Cardiovascular:     Rate and Rhythm: Normal rate and regular rhythm.     Heart sounds: No murmur heard.   No friction rub. No gallop.  Pulmonary:     Effort: Pulmonary effort is normal. No respiratory distress.     Breath sounds: Normal breath sounds.  Chest:     Chest wall: No tenderness.  Abdominal:     General: Abdomen is flat.     Palpations: Abdomen is soft.  Musculoskeletal:     Cervical back: No rigidity.     Comments: Decreased passive and active range of motion of left wrist. No acute pain over pollux flexor tendon sheath. Unable to fully oppose thumb to fifth digit.  Skin:    Capillary Refill: Capillary refill takes less than 2 seconds.     Comments: Warm, erythematous, indurated lesions on dorsal left wrist, distal ventral right forearm and proximal lateral right forearm. Puncture wound on left wrist is actively draining yellow pus. There is also a puncture wound in the right antecubital fossa.   Neurological:     General: No focal deficit present.     Mental Status: He is alert and oriented to person, place, and time.  Psychiatric:        Mood and Affect: Mood normal.        Behavior: Behavior normal.    ED Results / Procedures / Treatments   Labs (all labs ordered are listed, but only abnormal results are displayed) Labs Reviewed  COMPREHENSIVE METABOLIC PANEL - Abnormal; Notable for the following components:      Result Value   Sodium 133 (*)    Glucose, Bld 138 (*)    All other components within normal limits  CULTURE, BLOOD (SINGLE)  LACTIC ACID, PLASMA  LACTIC  ACID, PLASMA  CBC WITH DIFFERENTIAL/PLATELET  PROTIME-INR  APTT    EKG None  Radiology No results found.  Procedures Procedures   Medications Ordered in ED Medications  vancomycin (VANCOREADY) IVPB 2000 mg/400 mL (2,000 mg Intravenous New Bag/Given 10/01/20 2016)    ED Course  I have reviewed the triage vital signs and the nursing notes.  Pertinent labs & imaging results that were available during my care of the patient were reviewed by me and considered in my medical decision making (see chart for details).     MDM Rules/Calculators/A&P                         Patient arrived after evaluation by urgent care with evidently infected areas on bil UE. I investigated for sepsis, bacteremia. Lactic acid unremarkable. Blood cultures in progress. No leukocytosis. US performed on largest area of induration on right forearm. Cobblestoning pattern consistent with cellulitis was seen, without appreciation of any large fluid collection. Patient was judged to be low risk for sepsis, endocarditis, as no signs of fever, chest pain, body aches, difficulty breathing and with unremarkable lab workup. Given no fluid collection, opted to forego drainage of any of the areas of cellulitis today. Patient was given a dose of vancomycin while in the ED and prescribed bactrim to take for the next two weeks. Patient  instructed to follow up with community health in 48 hours and to return to the ED if new onset fever, chest pain, worsening function of left wrist, or spreading of any of the current areas of cellulitis. Final Clinical Impression(s) / ED Diagnoses Final diagnoses:  Cellulitis of other specified site  IVDU (intravenous drug user)    Rx / DC Orders ED Discharge Orders          Ordered    sulfamethoxazole-trimethoprim (BACTRIM DS) 800-160 MG tablet  2 times daily        10/01/20 2140             Honestie Kulik, Jackalyn Lombard 10/01/20 2208    Noemi Chapel, MD 10/02/20 1501

## 2020-10-01 NOTE — ED Provider Notes (Signed)
Medical screening examination/treatment/procedure(s) were conducted as a shared visit with non-physician practitioner(s) and myself.  I personally evaluated the patient during the encounter.  Clinical Impression:   Final diagnoses:  Cellulitis of other specified site  IVDU (intravenous drug user)    IVDU -  by skin popping - heroin - for a couple of years - last used 3 days ago - presents with swelling and warmth to upper extremities - multiple areas that look infected on exam - has new murmur - concern for endocarditis biut without murmur and normal VS, less likely - labs pending  US of the arm to look for celulitis / vs abscess  Ultrasound of the arm at the bedside is negative as performed by myself, this patient has no leukocytosis no tachycardia no fever no hypotension and is otherwise well-appearing.  I counseled the patient at length regarding his IV drug use, this patient is stable for discharge with antibiotics  Vancomycin - IV Labs pending  Patient agreeable to the plan and agreeable to return should symptoms worsen   Noemi Chapel, MD 10/02/20 [phone]

## 2020-10-01 NOTE — ED Notes (Signed)
Pt called multiple times for triage, no answer.

## 2020-10-01 NOTE — Discharge Instructions (Addendum)
Go immediately to the [facility] emergency department.  I think you need IV antibiotics.  I am concerned that you have an infection of your wrist joint, and I am concerned that you could become septic or get a heart infection if this is not treated aggressively enough in a timely fashion.  I do not think that course of oral antibiotics is going to be safe.  Please do not leave until your ER evaluation is complete.  Let them know if you start feeling feverish, if the redness spreads outside of the marks that we made here

## 2020-10-01 NOTE — Discharge Instructions (Addendum)
Return to the emergency department if your condition worsens or fails to improve. Return if you become feverish, if the wounds increase in size, if the purulent drainage increase, or you become unable to move the affected joints.

## 2020-10-01 NOTE — ED Triage Notes (Signed)
Pt presents with wounds to BUE from IV drug use. Wounds are red, swollen and warm to touch. Hx HTN, diabetes.

## 2020-10-01 NOTE — ED Triage Notes (Signed)
Pt presents with multiple sites of infection after drug use. Pt c/o swelling, redness, drainage, and pain to touch on left right, Right AC, and right forearm xs 2 days. States this was the last time he used drugs.   Pt states also needing refill on BP and diabetic medications. In process of finding new PCP after insurance change.

## 2020-10-01 NOTE — ED Provider Notes (Signed)
HPI  SUBJECTIVE:  Adrian Castillo is a 65 y.o. male who presents with multiple painful areas of expanding erythema, edema and pain on his upper extremities starting 2 days ago.  States that he was skin popping heroin with tap water.  States that he does this only on his upper extremities.  He stuck a pin in the lesion on his right  AC fossa and drained blood.  He states pus is starting to come out of the lesion on his left wrist.  He denies sharing needles.  No fevers, body aches, chest pain, shortness of breath.  No antipyretic in the past 6 hours.  He tried draining these lesions with a pen and antibiotic ointment without improvement in his symptoms.  There are no aggravating factors.  He reports limitation of motion of his left wrist due to edema and pain.  He states that the erythema is getting rapidly bigger at all sites.  He has never had symptoms like this before.  He has a past medical history of diabetes, IVDU/skin popping heroin, hypertension, hepatitis C.  No history of MRSA, endocarditis, sepsis.  PMD: None.    Past Medical History:  Diagnosis Date   Diabetes mellitus without complication ([place])    Hep C w/o coma, chronic ([place])    Heroin addiction ([place])    Hypertension     History reviewed. No pertinent surgical history.  History reviewed. No pertinent family history.  Social History   Tobacco Use   Smoking status: Every Day    Packs/day: 0.50    Types: Cigarettes   Smokeless tobacco: Never  Substance Use Topics   Alcohol use: No   Drug use: No    Types: IV    Comment: HEROIN    No current facility-administered medications for this encounter.  Current Outpatient Medications:    albuterol (PROVENTIL HFA;VENTOLIN HFA) 108 (90 Base) MCG/ACT inhaler, Inhale 2 puffs into the lungs every 4 (four) hours as needed for wheezing or shortness of breath., Disp: 1 Inhaler, Rfl: 0   amLODipine (NORVASC) 10 MG tablet, Take 1 tablet (10 mg total) by mouth daily., Disp: 30 tablet, Rfl:  0   amLODipine (NORVASC) 10 MG tablet, TAKE 1 TABLET (10 MG TOTAL) BY MOUTH DAILY., Disp: 30 tablet, Rfl: 0   glipiZIDE (GLUCOTROL) 10 MG tablet, Take 10 mg by mouth daily. , Disp: , Rfl:    hydrochlorothiazide (HYDRODIURIL) 25 MG tablet, Take 1 tablet (25 mg total) by mouth daily., Disp: 30 tablet, Rfl: 0   ibuprofen (ADVIL,MOTRIN) 800 MG tablet, Take 800-1,600 mg by mouth every 8 (eight) hours as needed (for pain). , Disp: , Rfl:    losartan (COZAAR) 25 MG tablet, Take 12.5 mg by mouth daily., Disp: , Rfl:    naloxone (NARCAN) nasal spray 4 mg/0.1 mL, Use one spray in the nostril in the case of an overdose, Disp: 1 each, Rfl: 0   naloxone (NARCAN) nasal spray 4 mg/0.1 mL, USE ONE SPRAY IN THE NOSTRIL IN THE CASE OF AN OVERDOSE, Disp: 2 each, Rfl: 0   naproxen (NAPROSYN) 500 MG tablet, Take 500 mg by mouth 2 (two) times daily with a meal., Disp: , Rfl:    potassium chloride SA (K-DUR,KLOR-CON) 20 MEQ tablet, Take 2 tablets (40 mEq total) by mouth 2 (two) times daily. (Patient taking differently: Take 20 mEq by mouth daily as needed (for leg cramps). ), Disp: 16 tablet, Rfl: 0  Allergies  Allergen Reactions   Crab [Shellfish Allergy] Hives  Soft crab (receives TOO MUCH iodine) eats hard-shell crap with no issues   Tylenol [Acetaminophen] Hives     ROS  As noted in HPI.   Physical Exam  BP (!) 149/83 (BP Location: Right Arm)   Pulse 68   Temp 99.1 F (37.3 C) (Oral)   Resp 17   SpO2 98%   Constitutional: Well developed, well nourished, no acute distress Eyes:  EOMI, conjunctiva normal bilaterally HENT: Normocephalic, atraumatic,mucus membranes moist Respiratory: Normal inspiratory effort Cardiovascular: Normal rate.  No murmur. GI: nondistended skin: See musculoskeletal exam Musculoskeletal:  Marked all areas of erythema with a marker for reference.  Left wrist swollen, diffusely tender.  Left hand swollen, tender over the entire dorsum of the hand.  Area of tender  induration, erythema 15 x 12 cm.  Positive expressible purulent drainage from ulcer over the radius.  Patient has limited range of motion due to pain, and is unable to completely oppose his thumb due to hand edema.   Entire right forearm is swollen with 1+ edema. 12 x 9 cm tender area of erythema, induration lateral right forearm.  No expressible purulent drainage.    12 x 9 cm area of induration, erythema with central purplish discoloration.   Small nontender area of swelling over left bicep        Neurologic: Alert & oriented x 3, no focal neuro deficits Psychiatric: Speech and behavior appropriate   ED Course   Medications - No data to display  Orders Placed This Encounter  Procedures   Aerobic Culture w Gram Stain (superficial specimen)    Standing Status:   Standing    Number of Occurrences:   1    No results found for this or any previous visit (from the past 24 hour(s)). No results found.  ED Clinical Impression  1. Cellulitis of right upper extremity   2. Abscess, wrist   3. Heroin abuse [facility])      ED Assessment/Plan  Feel that patient needs emergency department evaluation with IV antibiotics.  Do not think that a shot of Rocephin and oral antibiotics is going to be a safe option.  Concern for impending sepsis, septic joint, endocarditis with patient.  Emphasized importance of going to the emergency department.  Patient has opted to go via private vehicle to the [facility] emergency department.  No orders of the defined types were placed in this encounter.     *This clinic note was created using Dragon dictation software. Therefore, there may be occasional mistakes despite careful proofreading.  ?    Melynda Ripple, MD 10/01/20 [phone]

## 2020-10-01 NOTE — ED Provider Notes (Signed)
Emergency Medicine Provider Triage Evaluation Note  Adrian Castillo , a 65 y.o. male  was evaluated in triage.  Pt complains of wounds, swelling, warmth and erythema to bilateral upper extremities.  Reports that symptoms started 2 days prior have gotten progressively worse over this time.  He reports he tried to drain areas of swelling last night at home.  Patient had purulent discharge expressed from wound to left wrist.  Patient denies any fevers, chills, nausea, vomiting, diarrhea.  Patient endorses IV drug use.  States he last used IV drugs 2 days prior.  Patient is unsure when his last tetanus shot was  Review of Systems  Positive: Wounds, swelling, warmth, erythema Negative: Fever, chills, nausea, vomiting  Physical Exam  BP (!) 159/106 (BP Location: Left Arm)   Pulse 77   Temp 99.2 F (37.3 C) (Oral)   Resp 20   SpO2 96%  Gen:   Awake, no distress   Resp:  Normal effort  MSK:   Moves extremities without difficulty, 2 areas of swelling and erythema to right forearm.  Wound an area of swelling to left wrist. Other:    Medical Decision Making  Medically screening exam initiated at 6:24 PM.  Appropriate orders placed.  Elam Apodaca was informed that the remainder of the evaluation will be completed by another provider, this initial triage assessment does not replace that evaluation, and the importance of remaining in the ED until their evaluation is complete.  The patient appears stable so that the remainder of the work up may be completed by another provider.      Loni Beckwith, PA-C 10/01/20 1826    Drenda Freeze, MD 10/01/20 [phone]

## 2020-10-02 ENCOUNTER — Other Ambulatory Visit: Payer: Self-pay

## 2020-10-04 LAB — AEROBIC CULTURE W GRAM STAIN (SUPERFICIAL SPECIMEN): Gram Stain: NONE SEEN

## 2020-10-06 LAB — CULTURE, BLOOD (SINGLE)
Culture: NO GROWTH
Special Requests: ADEQUATE

## 2020-12-10 NOTE — Progress Notes (Signed)
Subjective:    Adrian Castillo - 64 y.o. male MRN [id number]  Date of birth: 10-31-1955  HPI  Adrian Castillo is to establish care.  Current issues and/or concerns: HYPERTENSION: Currently taking: see medication list Have you taken your blood pressure medication today: []  Yes [x]  No and has not taken any for a couple of days because forgetting to take at bedtime which is when he normally takes Med Adherence: [x]  Yes    []  No Medication side effects: []  Yes    [x]  No Adherence with salt restriction (low-salt diet): [x]  Yes  []  No Exercise: Yes [x]  No []  Home Monitoring?: [x]  Yes    []  No Monitoring Frequency: []  Yes    [x]  No Home BP results range: []  Yes    [x]  No Smoking [x]  Yes, 10 to 15 cigarettes daily SOB? []  Yes    [x]  No Chest Pain?: []  Yes    [x]  No  2. DIABETES TYPE 2: Med Adherence:  [x]  Yes    []  No Medication side effects:  []  Yes    [x]  No Home Monitoring?  []  Yes    [x]  No Diet Adherence: []  Yes  [x]  No Exercise: [x]  Yes    []  No Comments: Took Metformin in the past and did not like.   3. SHORTNESS OF BREATH: Happens sometimes and causes panic attacks. Thinks may have COPD.  4. KNEE PAIN: Chronic. Taking Ibuprofen as needed and helps.   ROS per HPI    Health Maintenance:  Health Maintenance Due  Topic Date Due   HIV Screening  Never done   Hepatitis C Screening  Never done   Zoster Vaccines- Shingrix (1 of 2) Never done   COLONOSCOPY (Pts 45-7yrs Insurance coverage will need to be confirmed)  09/24/2016     Past Medical History: There are no problems to display for this patient.   Social History   reports that he has been smoking cigarettes. He has been smoking an average of .5 packs per day. He has never used smokeless tobacco. He reports that he does not drink alcohol and does not use drugs.   Family History  family history is not on file.   Medications: reviewed and updated   Objective:   Physical Exam BP (!) 155/92   Pulse (!) 57   Ht 5'  11" (1.803 m)   Wt 199 lb 4 oz (90.4 kg)   SpO2 98%   BMI 27.79 kg/m   Physical Exam HENT:     Head: Normocephalic and atraumatic.  Eyes:     Extraocular Movements: Extraocular movements intact.     Conjunctiva/sclera: Conjunctivae normal.     Pupils: Pupils are equal, round, and reactive to light.  Cardiovascular:     Rate and Rhythm: Normal rate and regular rhythm.     Pulses: Normal pulses.     Heart sounds: Normal heart sounds.  Pulmonary:     Effort: Pulmonary effort is normal.     Breath sounds: Normal breath sounds.  Musculoskeletal:     Cervical back: Normal range of motion and neck supple.  Neurological:     General: No focal deficit present.     Mental Status: He is alert and oriented to person, place, and time.  Psychiatric:        Mood and Affect: Mood normal.        Behavior: Behavior normal.   Results for orders placed or performed in visit on 12/15/20  POCT glycosylated hemoglobin (Hb A1C)  Result Value Ref Range   Hemoglobin A1C 8.6 (A) 4.0 - 5.6 %   HbA1c POC (<> result, manual entry)     HbA1c, POC (prediabetic range)     HbA1c, POC (controlled diabetic range)      Assessment & Plan:  1. Encounter to establish care: - Patient presents today to establish care.  - Return for annual physical examination, labs, and health maintenance. Arrive fasting meaning having no food for at least 8 hours prior to appointment. You may have only water or black coffee. Please take scheduled medications as normal.  2. Essential hypertension: - Continue Amlodipine, Hydrochlorothiazide, and Losartan as prescribed.  - Counseled on blood pressure goal of less than 140/90, low-sodium, DASH diet, medication compliance, 150 minutes of moderate intensity exercise per week as tolerated. Discussed medication compliance, adverse effects. - Follow-up with primary provider in 2 weeks or sooner if needed.  - amLODipine (NORVASC) 10 MG tablet; Take 1 tablet (10 mg total) by mouth daily.   Dispense: 90 tablet; Refill: 0 - hydrochlorothiazide (HYDRODIURIL) 25 MG tablet; Take 1 tablet (25 mg total) by mouth daily.  Dispense: 90 tablet; Refill: 0 - losartan (COZAAR) 25 MG tablet; Take 0.5 tablets (12.5 mg total) by mouth daily.  Dispense: 45 tablet; Refill: 0  3. Type 2 diabetes mellitus without complication, without long-term current use of insulin ([place]): - Hemoglobin today not at goal at 8.6%, goal < 8%.  - Increase Glipizide from 10 mg once daily to 10 mg twice daily.  - Discussed the importance of healthy eating habits, low-carbohydrate diet, low-sugar diet, regular aerobic exercise (at least 150 minutes a week as tolerated) and medication compliance to achieve or maintain control of diabetes. - To achieve an A1C goal of less than or equal to 7.0 percent, a fasting blood sugar of 80 to 130 mg/dL and a postprandial glucose (90 to 120 minutes after a meal) less than 180 mg/dL. In the event of sugars less than 60 mg/dl or greater than 400 mg/dl please notify the clinic ASAP. It is recommended that you undergo annual eye exams and annual foot exams. - Follow-up with primary provider in 4 weeks or sooner if needed.  - POCT glycosylated hemoglobin (Hb A1C) - glipiZIDE (GLUCOTROL) 10 MG tablet; Take 1 tablet (10 mg total) by mouth 2 (two) times daily before a meal.  Dispense: 180 tablet; Refill: 0  4. Dyspnea, unspecified type: - Intermittent.  - Possible COPD. - Continue Albuterol as prescribed.  - Referral to Pulmonology for further evaluation and management.  - Follow-up with primary provider as scheduled.  - albuterol (VENTOLIN HFA) 108 (90 Base) MCG/ACT inhaler; Inhale 2 puffs into the lungs every 4 (four) hours as needed for wheezing or shortness of breath.  Dispense: 18 g; Refill: 1 - Ambulatory referral to Pulmonology  5. Arthritis of both knees: - Continue Ibuprofen as prescribed.  - Follow-up with primary provider as scheduled.  - ibuprofen (ADVIL) 800 MG tablet; Take 1  tablet (800 mg total) by mouth every 8 (eight) hours as needed (for pain).  Dispense: 30 tablet; Refill: 0  6. Flu vaccine need: - Administered today in office.  - Flu Vaccine QUAD 79mo+IM (Fluarix, Fluzone & Alfiuria Quad PF)  7. Need for pneumococcal vaccine: - Administered today in office.  - Pneumococcal conjugate vaccine 20-valent   Patient was given clear instructions to go to Emergency Department or return to medical center if symptoms don't improve, worsen, or new problems develop.The patient verbalized understanding.  I discussed the assessment and treatment plan with the patient. The patient was provided an opportunity to ask questions and all were answered. The patient agreed with the plan and demonstrated an understanding of the instructions.   The patient was advised to call back or seek an in-person evaluation if the symptoms worsen or if the condition fails to improve as anticipated.    Durene Fruits, NP 12/15/2020, 12:02 PM Primary Care at [facility]

## 2020-12-15 ENCOUNTER — Encounter: Payer: Self-pay | Admitting: Family

## 2020-12-15 ENCOUNTER — Other Ambulatory Visit: Payer: Self-pay

## 2020-12-15 ENCOUNTER — Ambulatory Visit (INDEPENDENT_AMBULATORY_CARE_PROVIDER_SITE_OTHER): Payer: Medicare (Managed Care) | Admitting: Family

## 2020-12-15 VITALS — BP 155/92 | HR 57 | Ht 71.0 in | Wt 199.2 lb

## 2020-12-15 DIAGNOSIS — E119 Type 2 diabetes mellitus without complications: Secondary | ICD-10-CM

## 2020-12-15 DIAGNOSIS — R0600 Dyspnea, unspecified: Secondary | ICD-10-CM | POA: Diagnosis not present

## 2020-12-15 DIAGNOSIS — M170 Bilateral primary osteoarthritis of knee: Secondary | ICD-10-CM | POA: Diagnosis not present

## 2020-12-15 DIAGNOSIS — Z7689 Persons encountering health services in other specified circumstances: Secondary | ICD-10-CM

## 2020-12-15 DIAGNOSIS — I10 Essential (primary) hypertension: Secondary | ICD-10-CM

## 2020-12-15 DIAGNOSIS — Z23 Encounter for immunization: Secondary | ICD-10-CM

## 2020-12-15 LAB — POCT GLYCOSYLATED HEMOGLOBIN (HGB A1C): Hemoglobin A1C: 8.6 % — AB (ref 4.0–5.6)

## 2020-12-15 MED ORDER — IBUPROFEN 800 MG PO TABS
800.0000 mg | ORAL_TABLET | Freq: Three times a day (TID) | ORAL | 0 refills | Status: DC | PRN
  Filled 2020-12-15 – 2020-12-22 (×2): qty 30, 10d supply, fill #0

## 2020-12-15 MED ORDER — GLIPIZIDE 10 MG PO TABS
10.0000 mg | ORAL_TABLET | Freq: Two times a day (BID) | ORAL | 0 refills | Status: DC
  Filled 2020-12-15 – 2020-12-22 (×2): qty 180, 90d supply, fill #0

## 2020-12-15 MED ORDER — HYDROCHLOROTHIAZIDE 25 MG PO TABS
25.0000 mg | ORAL_TABLET | Freq: Every day | ORAL | 0 refills | Status: DC
  Filled 2020-12-15 – 2020-12-22 (×2): qty 90, 90d supply, fill #0

## 2020-12-15 MED ORDER — LOSARTAN POTASSIUM 25 MG PO TABS
12.5000 mg | ORAL_TABLET | Freq: Every day | ORAL | 0 refills | Status: DC
  Filled 2020-12-15 – 2020-12-22 (×2): qty 45, 90d supply, fill #0

## 2020-12-15 MED ORDER — ALBUTEROL SULFATE HFA 108 (90 BASE) MCG/ACT IN AERS
2.0000 | INHALATION_SPRAY | RESPIRATORY_TRACT | 1 refills | Status: AC | PRN
  Filled 2020-12-15: qty 18, 16d supply, fill #0
  Filled 2020-12-22: qty 18, 17d supply, fill #0

## 2020-12-15 MED ORDER — AMLODIPINE BESYLATE 10 MG PO TABS
10.0000 mg | ORAL_TABLET | Freq: Every day | ORAL | 0 refills | Status: AC
  Filled 2020-12-15 – 2020-12-22 (×2): qty 90, 90d supply, fill #0

## 2020-12-15 NOTE — Patient Instructions (Signed)
Thank you for choosing Primary Care at [facility] for your medical home!    Adrian Castillo was seen by Camillia Herter, NP today.   [place] Chase primary care provider is Durene Fruits, NP.   For the best care possible,  you should try to see Durene Fruits, NP whenever you come to clinic.   We look forward to seeing you again soon!  If you have any questions about your visit today,  please call us at [phone]  Or feel free to reach your provider via [place].    Keeping you healthy   Get these tests Blood pressure- Have your blood pressure checked once a year by your healthcare provider.  Normal blood pressure is 120/80. Weight- Have your body mass index (BMI) calculated to screen for obesity.  BMI is a measure of body fat based on height and weight. You can also calculate your own BMI at [employer].it. Cholesterol- Have your cholesterol checked regularly starting at age 60, sooner may be necessary if you have diabetes, high blood pressure, if a family member developed heart diseases at an early age or if you smoke.  Chlamydia, HIV, and other sexual transmitted disease- Get screened each year until the age of 62 then within three months of each new sexual partner. Diabetes- Have your blood sugar checked regularly if you have high blood pressure, high cholesterol, a family history of diabetes or if you are overweight.   Get these vaccines Flu shot- Every fall. Tetanus shot- Every 10 years. Menactra- Single dose; prevents meningitis.   Take these steps Don't smoke- If you do smoke, ask your healthcare provider about quitting. For tips on how to quit, go to www.smokefree.gov or call 1-800-QUIT-NOW. Be physically active- Exercise 5 days a week for at least 30 minutes.  If you are not already physically active start slow and gradually work up to 30 minutes of moderate physical activity.  Examples of moderate activity include walking briskly, mowing the yard, dancing, swimming  bicycling, etc. Eat a healthy diet- Eat a variety of healthy foods such as fruits, vegetables, low fat milk, low fat cheese, yogurt, lean meats, poultry, fish, beans, tofu, etc.  For more information on healthy eating, go to www.thenutritionsource.org Drink alcohol in moderation- Limit alcohol intake two drinks or less a day.  Never drink and drive. Dentist- Brush and floss teeth twice daily; visit your dentis twice a year. Depression-Your emotional health is as important as your physical health.  If you're feeling down, losing interest in things you normally enjoy please talk with your healthcare provider. Gun Safety- If you keep a gun in your home, keep it unloaded and with the safety lock on.  Bullets should be stored separately. Helmet use- Always wear a helmet when riding a motorcycle, bicycle, rollerblading or skateboarding. Safe sex- If you may be exposed to a sexually transmitted infection, use a condom Seat belts- Seat bels can save your life; always wear one. Smoke/Carbon Monoxide detectors- These detectors need to be installed on the appropriate level of your home.  Replace batteries at least once a year. Skin Cancer- When out in the sun, cover up and use sunscreen SPF 15 or higher. Violence- If anyone is threatening or hurting you, please tell your healthcare provider.

## 2020-12-22 ENCOUNTER — Other Ambulatory Visit: Payer: Self-pay

## 2020-12-23 ENCOUNTER — Other Ambulatory Visit: Payer: Self-pay

## 2020-12-24 ENCOUNTER — Other Ambulatory Visit: Payer: Self-pay

## 2021-02-23 DIAGNOSIS — F1120 Opioid dependence, uncomplicated: Secondary | ICD-10-CM | POA: Diagnosis not present

## 2021-02-25 ENCOUNTER — Other Ambulatory Visit: Payer: Self-pay | Admitting: Family Medicine

## 2021-02-25 ENCOUNTER — Ambulatory Visit
Admission: RE | Admit: 2021-02-25 | Discharge: 2021-02-25 | Disposition: A | Discharge status: Home / Self Care | Payer: Medicare HMO | Source: Ambulatory Visit | Attending: Family Medicine | Admitting: Family Medicine

## 2021-02-25 DIAGNOSIS — R0602 Shortness of breath: Secondary | ICD-10-CM | POA: Diagnosis not present

## 2021-02-25 DIAGNOSIS — R109 Unspecified abdominal pain: Secondary | ICD-10-CM

## 2021-02-25 DIAGNOSIS — I10 Essential (primary) hypertension: Secondary | ICD-10-CM | POA: Diagnosis not present

## 2021-02-25 DIAGNOSIS — Z87898 Personal history of other specified conditions: Secondary | ICD-10-CM | POA: Diagnosis not present

## 2021-02-25 DIAGNOSIS — M25562 Pain in left knee: Secondary | ICD-10-CM | POA: Diagnosis not present

## 2021-02-25 DIAGNOSIS — M25561 Pain in right knee: Secondary | ICD-10-CM | POA: Diagnosis not present

## 2021-02-25 DIAGNOSIS — E119 Type 2 diabetes mellitus without complications: Secondary | ICD-10-CM | POA: Diagnosis not present

## 2021-03-01 DIAGNOSIS — F1120 Opioid dependence, uncomplicated: Secondary | ICD-10-CM | POA: Diagnosis not present

## 2021-03-02 DIAGNOSIS — F1120 Opioid dependence, uncomplicated: Secondary | ICD-10-CM | POA: Diagnosis not present

## 2021-03-03 DIAGNOSIS — F1120 Opioid dependence, uncomplicated: Secondary | ICD-10-CM | POA: Diagnosis not present

## 2021-03-09 DIAGNOSIS — F1120 Opioid dependence, uncomplicated: Secondary | ICD-10-CM | POA: Diagnosis not present

## 2021-03-16 DIAGNOSIS — F1120 Opioid dependence, uncomplicated: Secondary | ICD-10-CM | POA: Diagnosis not present

## 2021-03-23 DIAGNOSIS — F1120 Opioid dependence, uncomplicated: Secondary | ICD-10-CM | POA: Diagnosis not present

## 2021-03-25 DIAGNOSIS — R0602 Shortness of breath: Secondary | ICD-10-CM | POA: Diagnosis not present

## 2021-03-25 DIAGNOSIS — E119 Type 2 diabetes mellitus without complications: Secondary | ICD-10-CM | POA: Diagnosis not present

## 2021-03-25 DIAGNOSIS — Z720 Tobacco use: Secondary | ICD-10-CM | POA: Diagnosis not present

## 2021-03-25 DIAGNOSIS — I10 Essential (primary) hypertension: Secondary | ICD-10-CM | POA: Diagnosis not present

## 2021-03-25 DIAGNOSIS — R21 Rash and other nonspecific skin eruption: Secondary | ICD-10-CM | POA: Diagnosis not present

## 2021-03-25 DIAGNOSIS — Z87898 Personal history of other specified conditions: Secondary | ICD-10-CM | POA: Diagnosis not present

## 2021-03-25 DIAGNOSIS — E785 Hyperlipidemia, unspecified: Secondary | ICD-10-CM | POA: Diagnosis not present

## 2021-04-02 DIAGNOSIS — F1120 Opioid dependence, uncomplicated: Secondary | ICD-10-CM | POA: Diagnosis not present

## 2021-04-06 DIAGNOSIS — F1120 Opioid dependence, uncomplicated: Secondary | ICD-10-CM | POA: Diagnosis not present

## 2021-04-20 DIAGNOSIS — F1120 Opioid dependence, uncomplicated: Secondary | ICD-10-CM | POA: Diagnosis not present

## 2021-05-12 DIAGNOSIS — F1120 Opioid dependence, uncomplicated: Secondary | ICD-10-CM | POA: Diagnosis not present

## 2021-06-02 DIAGNOSIS — F1120 Opioid dependence, uncomplicated: Secondary | ICD-10-CM | POA: Diagnosis not present

## 2021-06-16 DIAGNOSIS — F1120 Opioid dependence, uncomplicated: Secondary | ICD-10-CM | POA: Diagnosis not present

## 2021-07-14 DIAGNOSIS — F1120 Opioid dependence, uncomplicated: Secondary | ICD-10-CM | POA: Diagnosis not present

## 2021-07-15 DIAGNOSIS — F1120 Opioid dependence, uncomplicated: Secondary | ICD-10-CM | POA: Diagnosis not present

## 2021-07-21 DIAGNOSIS — F1120 Opioid dependence, uncomplicated: Secondary | ICD-10-CM | POA: Diagnosis not present

## 2021-07-28 DIAGNOSIS — F1120 Opioid dependence, uncomplicated: Secondary | ICD-10-CM | POA: Diagnosis not present

## 2021-08-04 DIAGNOSIS — Z0001 Encounter for general adult medical examination with abnormal findings: Secondary | ICD-10-CM | POA: Diagnosis not present

## 2021-08-04 DIAGNOSIS — Z1159 Encounter for screening for other viral diseases: Secondary | ICD-10-CM | POA: Diagnosis not present

## 2021-08-04 DIAGNOSIS — E663 Overweight: Secondary | ICD-10-CM | POA: Diagnosis not present

## 2021-08-04 DIAGNOSIS — Z79899 Other long term (current) drug therapy: Secondary | ICD-10-CM | POA: Diagnosis not present

## 2021-08-04 DIAGNOSIS — F1121 Opioid dependence, in remission: Secondary | ICD-10-CM | POA: Diagnosis not present

## 2021-08-04 DIAGNOSIS — E559 Vitamin D deficiency, unspecified: Secondary | ICD-10-CM | POA: Diagnosis not present

## 2021-08-04 DIAGNOSIS — Z7189 Other specified counseling: Secondary | ICD-10-CM | POA: Diagnosis not present

## 2021-08-04 DIAGNOSIS — I10 Essential (primary) hypertension: Secondary | ICD-10-CM | POA: Diagnosis not present

## 2021-08-04 DIAGNOSIS — E119 Type 2 diabetes mellitus without complications: Secondary | ICD-10-CM | POA: Diagnosis not present

## 2021-08-19 DIAGNOSIS — F1120 Opioid dependence, uncomplicated: Secondary | ICD-10-CM | POA: Diagnosis not present

## 2022-01-04 ENCOUNTER — Emergency Department (HOSPITAL_COMMUNITY): Payer: Medicare Other

## 2022-01-04 ENCOUNTER — Emergency Department (HOSPITAL_COMMUNITY)
Admission: EM | Admit: 2022-01-04 | Discharge: 2022-01-05 | Disposition: A | Discharge status: Home / Self Care | Payer: Medicare Other | Source: Home / Self Care | Attending: Emergency Medicine | Admitting: Emergency Medicine

## 2022-01-04 ENCOUNTER — Encounter (HOSPITAL_COMMUNITY): Payer: Self-pay

## 2022-01-04 DIAGNOSIS — E871 Hypo-osmolality and hyponatremia: Secondary | ICD-10-CM | POA: Insufficient documentation

## 2022-01-04 DIAGNOSIS — R509 Fever, unspecified: Secondary | ICD-10-CM | POA: Insufficient documentation

## 2022-01-04 DIAGNOSIS — D72829 Elevated white blood cell count, unspecified: Secondary | ICD-10-CM | POA: Insufficient documentation

## 2022-01-04 DIAGNOSIS — R600 Localized edema: Secondary | ICD-10-CM | POA: Insufficient documentation

## 2022-01-04 DIAGNOSIS — I10 Essential (primary) hypertension: Secondary | ICD-10-CM | POA: Insufficient documentation

## 2022-01-04 DIAGNOSIS — M7989 Other specified soft tissue disorders: Secondary | ICD-10-CM | POA: Insufficient documentation

## 2022-01-04 DIAGNOSIS — L03113 Cellulitis of right upper limb: Secondary | ICD-10-CM

## 2022-01-04 DIAGNOSIS — Z79899 Other long term (current) drug therapy: Secondary | ICD-10-CM | POA: Insufficient documentation

## 2022-01-04 DIAGNOSIS — E119 Type 2 diabetes mellitus without complications: Secondary | ICD-10-CM | POA: Insufficient documentation

## 2022-01-04 DIAGNOSIS — Z7984 Long term (current) use of oral hypoglycemic drugs: Secondary | ICD-10-CM | POA: Insufficient documentation

## 2022-01-04 MED ORDER — KETOROLAC TROMETHAMINE 30 MG/ML IJ SOLN: 30.0000 mg | Freq: Once | INTRAMUSCULAR | Status: DC

## 2022-01-04 MED ORDER — CEFTRIAXONE SODIUM 1 G IJ SOLR
1.0000 g | Freq: Once | INTRAMUSCULAR | Status: DC
  Filled 2022-01-04: qty 10

## 2022-01-04 NOTE — ED Provider Triage Note (Signed)
Emergency Medicine Provider Triage Evaluation Note  Adrian Castillo , a 66 y.o. male  was evaluated in triage.  Pt complains of rash on the RUE x2 days. Associated fever and chills. Reports IVDU.  Review of Systems  Positive: Rash on RUE Negative: Nausea, vomiting  Physical Exam  BP (!) 152/91 (BP Location: Left Arm)   Pulse 70   Temp 99.1 F (37.3 C) (Oral)   Resp 16   Ht '5\' 11"'$  (1.803 m)   Wt 88.5 kg   SpO2 100%   BMI 27.20 kg/m  Gen:   Awake, no distress   Resp:  Normal effort  MSK:   Moves extremities without difficulty  Other:  +erythematous fluctuant mass on RUE  Medical Decision Making  Medically screening exam initiated at 12:43 PM.  Appropriate orders placed.  Adrian Castillo was informed that the remainder of the evaluation will be completed by another provider, this initial triage assessment does not replace that evaluation, and the importance of remaining in the ED until their evaluation is complete.    Osvaldo Shipper, [place] 01/04/22 1244

## 2022-01-04 NOTE — ED Triage Notes (Signed)
Pt c/o R arm redness and swelling for two days. Pt states he uses IV drugs and recently accessed that site. Pt denies fevers.

## 2022-01-05 ENCOUNTER — Other Ambulatory Visit: Payer: Self-pay

## 2022-01-05 LAB — COMPREHENSIVE METABOLIC PANEL
ALT: 15 U/L (ref 0–44)
AST: 19 U/L (ref 15–41)
Albumin: 3.4 g/dL — ABNORMAL LOW (ref 3.5–5.0)
Alkaline Phosphatase: 60 U/L (ref 38–126)
Anion gap: 11 (ref 5–15)
BUN: 8 mg/dL (ref 8–23)
CO2: 28 mmol/L (ref 22–32)
Calcium: 9.2 mg/dL (ref 8.9–10.3)
Chloride: 94 mmol/L — ABNORMAL LOW (ref 98–111)
Creatinine, Ser: 0.89 mg/dL (ref 0.61–1.24)
GFR, Estimated: >60 mL/min (ref >60)
Glucose, Bld: 208 mg/dL — ABNORMAL HIGH (ref 70–99)
Potassium: 4.2 mmol/L (ref 3.5–5.1)
Sodium: 133 mmol/L — ABNORMAL LOW (ref 135–145)
Total Bilirubin: 0.7 mg/dL (ref 0.3–1.2)
Total Protein: 7.9 g/dL (ref 6.5–8.1)

## 2022-01-05 LAB — CBC WITH DIFFERENTIAL/PLATELET
Abs Immature Granulocytes: 0.06 10*3/uL (ref 0.00–0.07)
Basophils Absolute: 0 10*3/uL (ref 0.0–0.1)
Basophils Relative: 0 %
Eosinophils Absolute: 0 10*3/uL (ref 0.0–0.5)
Eosinophils Relative: 0 %
HCT: 42.2 % (ref 39.0–52.0)
Hemoglobin: 13.7 g/dL (ref 13.0–17.0)
Immature Granulocytes: 1 %
Lymphocytes Relative: 11 %
Lymphs Abs: 1.4 10*3/uL (ref 0.7–4.0)
MCH: 25.7 pg — ABNORMAL LOW (ref 26.0–34.0)
MCHC: 32.5 g/dL (ref 30.0–36.0)
MCV: 79.2 fL — ABNORMAL LOW (ref 80.0–100.0)
Monocytes Absolute: 1.1 10*3/uL — ABNORMAL HIGH (ref 0.1–1.0)
Monocytes Relative: 9 %
Neutro Abs: 10.5 10*3/uL — ABNORMAL HIGH (ref 1.7–7.7)
Neutrophils Relative %: 79 %
Platelets: 215 10*3/uL (ref 150–400)
RBC: 5.33 MIL/uL (ref 4.22–5.81)
RDW: 13.4 % (ref 11.5–15.5)
WBC: 13.2 10*3/uL — ABNORMAL HIGH (ref 4.0–10.5)
nRBC: 0 % (ref 0.0–0.2)

## 2022-01-05 LAB — LACTIC ACID, PLASMA: Lactic Acid, Venous: 1.2 mmol/L (ref 0.5–1.9)

## 2022-01-05 MED ORDER — VANCOMYCIN HCL IN DEXTROSE 1-5 GM/200ML-% IV SOLN
1000.0000 mg | Freq: Once | INTRAVENOUS | Status: AC
  Administered 2022-01-05: 1000 mg via INTRAVENOUS
  Filled 2022-01-05: qty 200

## 2022-01-05 MED ORDER — SULFAMETHOXAZOLE-TRIMETHOPRIM 800-160 MG PO TABS
1.0000 | ORAL_TABLET | Freq: Two times a day (BID) | ORAL | 0 refills | Status: DC
  Filled 2022-01-05: qty 20, 10d supply, fill #0

## 2022-01-05 MED ORDER — KETOROLAC TROMETHAMINE 15 MG/ML IJ SOLN
15.0000 mg | Freq: Once | INTRAMUSCULAR | Status: AC
  Administered 2022-01-05: 15 mg via INTRAVENOUS
  Filled 2022-01-05: qty 1

## 2022-01-05 NOTE — ED Provider Notes (Signed)
[facility] EMERGENCY DEPARTMENT Provider Note   CSN: [id number] Arrival date & time: 01/04/22  1119     History  Chief Complaint  Patient presents with   Cellulitis    Adrian Castillo is a 66 y.o. male with ongoing IVDU who presents with concern for redness swelling and pain to the back of the right forearm x48 hours.  Patient states that this is a frequent injection site for him and that he had the skin infections in the past requiring antibiotics.  He endorses chills and subjective fevers but has not taken his temperature.  No numbness tingling or weakness in the hand but does endorse it feels tight secondary to the swelling.  No nausea vomiting.  He has not taken any medication at home for the pain.  Last usage of heroin was 2 days ago.  Denies any other illicit drug use.  I personally reviewed his medical records.  Has history of diabetes, hypertension, hep C.  He is not anticoagulated.  HPI     Home Medications Prior to Admission medications   Medication Sig Start Date End Date Taking? Authorizing Provider  albuterol (VENTOLIN HFA) 108 (90 Base) MCG/ACT inhaler Inhale 2 puffs into the lungs every 4 (four) hours as needed for wheezing or shortness of breath. 12/15/20   Camillia Herter, NP  amLODipine (NORVASC) 10 MG tablet Take 1 tablet (10 mg total) by mouth daily. 12/15/20 03/24/21  Camillia Herter, NP  glipiZIDE (GLUCOTROL) 10 MG tablet Take 1 tablet (10 mg total) by mouth 2 (two) times daily before a meal. 12/15/20 03/24/21  Camillia Herter, NP  hydrochlorothiazide (HYDRODIURIL) 25 MG tablet Take 1 tablet (25 mg total) by mouth daily. 12/15/20 03/15/21  Camillia Herter, NP  ibuprofen (ADVIL) 800 MG tablet Take 1 tablet (800 mg total) by mouth every 8 (eight) hours as needed (for pain). 12/15/20   Camillia Herter, NP  losartan (COZAAR) 25 MG tablet Take 0.5 tablets (12.5 mg total) by mouth daily. 12/15/20 03/24/21  Camillia Herter, NP  naloxone [facility]) nasal spray 4  mg/0.1 mL Use one spray in the nostril in the case of an overdose Patient not taking: No sig reported 03/31/20   Couture, Cortni S, PA-C  potassium chloride SA (K-DUR,KLOR-CON) 20 MEQ tablet Take 2 tablets (40 mEq total) by mouth 2 (two) times daily. Patient taking differently: Take 20 mEq by mouth daily as needed (for leg cramps).  04/10/16 10/03/17  Long, Wonda Olds, MD      Allergies    Otho Darner allergy] and Tylenol [acetaminophen]    Review of Systems   Review of Systems  Musculoskeletal:        Pain redness and swelling to the right arm    Physical Exam Updated Vital Signs BP (!) 168/92 (BP Location: Left Arm)   Pulse (!) 54   Temp 98.7 F (37.1 C) (Oral)   Resp 16   Ht '5\' 11"'$  (1.803 m)   Wt 88.5 kg   SpO2 95%   BMI 27.20 kg/m  Physical Exam Vitals and nursing note reviewed.  Constitutional:      Appearance: He is not ill-appearing or toxic-appearing.  HENT:     Head: Normocephalic and atraumatic.     Mouth/Throat:     Mouth: Mucous membranes are moist.     Pharynx: No oropharyngeal exudate or posterior oropharyngeal erythema.  Eyes:     General:        Right eye: No  discharge.        Left eye: No discharge.     Conjunctiva/sclera: Conjunctivae normal.     Pupils: Pupils are equal, round, and reactive to light.  Cardiovascular:     Rate and Rhythm: Normal rate and regular rhythm.     Pulses: Normal pulses.     Heart sounds: Normal heart sounds. No murmur heard. Pulmonary:     Effort: Pulmonary effort is normal. No respiratory distress.     Breath sounds: Normal breath sounds. No wheezing or rales.  Abdominal:     General: Bowel sounds are normal. There is no distension.     Palpations: Abdomen is soft.     Tenderness: There is no abdominal tenderness. There is no guarding or rebound.  Musculoskeletal:        General: No deformity.     Right upper arm: Normal.     Right elbow: Swelling present.     Right forearm: Swelling and tenderness present.      Right wrist: Swelling and tenderness present. No snuff box tenderness or crepitus. Normal range of motion. Normal pulse.     Right hand: Swelling present. No tenderness or bony tenderness. Normal range of motion. There is no disruption of two-point discrimination. Normal capillary refill. Normal pulse.     Cervical back: Neck supple.     Right lower leg: No edema.     Left lower leg: No edema.     Comments: Soft tissue swelling of the right forearm extending from the dorsum of the hand all the way up to the elbow primarily in the dorsal aspect of the arm.  Redness marked with marking pen in the ED.  Skin:    General: Skin is warm and dry.     Capillary Refill: Capillary refill takes less than 2 seconds.  Neurological:     General: No focal deficit present.     Mental Status: He is alert and oriented to person, place, and time. Mental status is at baseline.  Psychiatric:        Mood and Affect: Mood normal.     ED Results / Procedures / Treatments   Labs (all labs ordered are listed, but only abnormal results are displayed) Labs Reviewed  CBC WITH DIFFERENTIAL/PLATELET - Abnormal; Notable for the following components:      Result Value   WBC 13.2 (*)    MCV 79.2 (*)    MCH 25.7 (*)    Neutro Abs 10.5 (*)    Monocytes Absolute 1.1 (*)    All other components within normal limits  COMPREHENSIVE METABOLIC PANEL - Abnormal; Notable for the following components:   Sodium 133 (*)    Chloride 94 (*)    Glucose, Bld 208 (*)    Albumin 3.4 (*)    All other components within normal limits  CULTURE, BLOOD (ROUTINE X 2)  CULTURE, BLOOD (ROUTINE X 2)  LACTIC ACID, PLASMA  LACTIC ACID, PLASMA    EKG None  Radiology DG Forearm Right  Result Date: 01/04/2022 CLINICAL DATA:  66 year old male, history of IV drug use and concern for abscess. EXAM: RIGHT FOREARM - 2 VIEW COMPARISON:  None Available. FINDINGS: Soft tissue swelling over the radial aspect of the forearm the soft tissues.  Generalized subcutaneous edema is suggested, edema throughout the forearm. No radiopaque foreign body or acute bony abnormality. IMPRESSION: 1. Soft tissue swelling over the radial aspect of the forearm. Perhaps this represents the site of suspected abscess though there is  diffuse edema throughout the forearm. Findings may reflect more extensive soft tissue infection and or cellulitis. No radiopaque foreign body or acute bony abnormality. Electronically Signed   By: Zetta Bills M.D.   On: 01/04/2022 13:24    Procedures Ultrasound ED Soft Tissue  Date/Time: 01/05/2022 6:29 AM  Performed by: Emeline Darling, PA-C Authorized by: Emeline Darling, PA-C   Procedure details:    Indications: localization of abscess and evaluate for cellulitis     Transverse view:  Visualized   Longitudinal view:  Visualized   Images: not archived   Location:    Location: upper extremity     Location comment:  Dorsal forearm   Side:  Right Findings:     no abscess present    cellulitis present Comments:     Patient with extensive soft tissue edema and cobblestoning throughout the dorsum of the right forearm without abscess.     Medications Ordered in ED Medications  vancomycin (VANCOCIN) IVPB 1000 mg/200 mL premix (has no administration in time range)  ketorolac (TORADOL) 15 MG/ML injection 15 mg (has no administration in time range)    ED Course/ Medical Decision Making/ A&P                           Medical Decision Making 66 year old male with history of IV drug use who presents with redness swelling and pain in his right forearm at injection sites.  Hypertensive on intake, vitals otherwise normal.  Cardiopulmonary exam is normal, dental exam is benign.  There is no murmur on cardiac exam.  Musculoskeletal exam as above with findings concerning for cellulitis of the right forearm primarily the dorsal aspect without abscess.  Normal neurovascular status in the hand.  Amount and/or  Complexity of Data Reviewed Labs:     Details: CBC with leukocytosis of 13,000, without anemia.  CMP with hyponatremia of 133 but otherwise unremarkable.  Lactic acid is normal at 1.2.  Blood cultures are pending at this time.  Plain film of the right arm with soft tissue swelling over the radial aspect of the forearm without acute osseous abnormality.  Risk Prescription drug management.    At time of shift change patient is signed out to oncoming ED provider Domenic Moras, PA-C.  Patient pending collection of second blood cultures and then IV antibiotic administration.  Given patient is not septic on vital signs and has overall reassuring laboratory studies with normal lactic acid do not feel admission to the hospital is warranted at this time.  Will prescribe outpatient Bactrim given patient's history of high risk behavior with IV drug use.  Patient will require reevaluation and ultimate disposition at the discretion of oncoming ED team.  Randall Hiss voiced understanding of his medical evaluation and treatment plan. Each of their questions answered to their expressed satisfaction.  Return precautions were given.  Patient is well-appearing, stable, and was discharged in good condition.  This chart was dictated using voice recognition software, Dragon. Despite the best efforts of this provider to proofread and correct errors, errors may still occur which can change documentation meaning.  Final Clinical Impression(s) / ED Diagnoses Final diagnoses:  None    Rx / DC Orders ED Discharge Orders     None         Emeline Darling, PA-C 01/05/22 7253    Merryl Hacker, MD 01/06/22 2316

## 2022-01-05 NOTE — ED Notes (Signed)
Attempted IV start x 1 without success.  Patient with hx of IV drug use and has scar tissue noted to multiple areas.

## 2022-01-05 NOTE — Discharge Instructions (Signed)
You are seen in the ER today for your skin infection on your right arm.  Fortunately your blood work was reassuring.  There is no abscess on your ultrasound.  Please take the prescribed antibiotics for the entire course.  Use Tylenol or ibuprofen as needed for your symptoms.  We would encourage you to stop using all recreational drugs as these can be exceedingly dangerous for you.  Follow-up with your primary care doctor as soon as possible and return to the ER if you develop any worsening swelling, extension of the redness outside of the marker line today, new numbness tingling or weakness in the arm, fevers chills nausea vomiting, or any other new severe symptom.

## 2022-01-05 NOTE — ED Provider Notes (Signed)
Received signout from previous provider, please see her note for complete H&P.  This is a 66 year old male significant history of IV drug use presenting with complaints of redness and swelling involving his right forearm for the past 48 hours.  Patient admits this is a frequent injection site for him.  He does not endorse any fever chills or body aches.  My partner has evaluated the patient and has used bedside ultrasound to assess for potential abscess but did not appreciate any obvious abscess amenable for incision and drainage.  Patient was started on vancomycin.  On reassessment by me, patient reported feeling better.  He still has quite a bit of erythema and induration noted to his right forearm however it does not appears to extend beyond a drawn margin that was placed earlier.  Will discharge home with Bactrim but encourage patient to return promptly if no improvement as this conforming to abscess.  Patient made aware of potential medication side effects to look out for.    BP (!) 165/105 (BP Location: Left Arm)   Pulse (!) 53   Temp 98.7 F (37.1 C) (Oral)   Resp 16   Ht '5\' 11"'$  (1.803 m)   Wt 88.5 kg   SpO2 96%   BMI 27.20 kg/m   Results for orders placed or performed during the hospital encounter of 01/04/22  CBC with Differential  Result Value Ref Range   WBC 13.2 (H) 4.0 - 10.5 K/uL   RBC 5.33 4.22 - 5.81 MIL/uL   Hemoglobin 13.7 13.0 - 17.0 g/dL   HCT 42.2 39.0 - 52.0 %   MCV 79.2 (L) 80.0 - 100.0 fL   MCH 25.7 (L) 26.0 - 34.0 pg   MCHC 32.5 30.0 - 36.0 g/dL   RDW 13.4 11.5 - 15.5 %   Platelets 215 150 - 400 K/uL   nRBC 0.0 0.0 - 0.2 %   Neutrophils Relative % 79 %   Neutro Abs 10.5 (H) 1.7 - 7.7 K/uL   Lymphocytes Relative 11 %   Lymphs Abs 1.4 0.7 - 4.0 K/uL   Monocytes Relative 9 %   Monocytes Absolute 1.1 (H) 0.1 - 1.0 K/uL   Eosinophils Relative 0 %   Eosinophils Absolute 0.0 0.0 - 0.5 K/uL   Basophils Relative 0 %   Basophils Absolute 0.0 0.0 - 0.1 K/uL    Immature Granulocytes 1 %   Abs Immature Granulocytes 0.06 0.00 - 0.07 K/uL  Lactic acid, plasma  Result Value Ref Range   Lactic Acid, Venous 1.2 0.5 - 1.9 mmol/L  Comprehensive metabolic panel  Result Value Ref Range   Sodium 133 (L) 135 - 145 mmol/L   Potassium 4.2 3.5 - 5.1 mmol/L   Chloride 94 (L) 98 - 111 mmol/L   CO2 28 22 - 32 mmol/L   Glucose, Bld 208 (H) 70 - 99 mg/dL   BUN 8 8 - 23 mg/dL   Creatinine, Ser 0.89 0.61 - 1.24 mg/dL   Calcium 9.2 8.9 - 10.3 mg/dL   Total Protein 7.9 6.5 - 8.1 g/dL   Albumin 3.4 (L) 3.5 - 5.0 g/dL   AST 19 15 - 41 U/L   ALT 15 0 - 44 U/L   Alkaline Phosphatase 60 38 - 126 U/L   Total Bilirubin 0.7 0.3 - 1.2 mg/dL   GFR, Estimated >60 >60 mL/min   Anion gap 11 5 - 15   DG Forearm Right  Result Date: 01/04/2022 CLINICAL DATA:  66 year old male, history of  IV drug use and concern for abscess. EXAM: RIGHT FOREARM - 2 VIEW COMPARISON:  None Available. FINDINGS: Soft tissue swelling over the radial aspect of the forearm the soft tissues. Generalized subcutaneous edema is suggested, edema throughout the forearm. No radiopaque foreign body or acute bony abnormality. IMPRESSION: 1. Soft tissue swelling over the radial aspect of the forearm. Perhaps this represents the site of suspected abscess though there is diffuse edema throughout the forearm. Findings may reflect more extensive soft tissue infection and or cellulitis. No radiopaque foreign body or acute bony abnormality. Electronically Signed   By: Zetta Bills M.D.   On: 01/04/2022 13:24      Domenic Moras, PA-C 01/05/22 1009    Dorie Rank, MD 01/06/22 [phone]

## 2022-01-05 NOTE — ED Notes (Signed)
IV team at bedside 

## 2022-01-05 NOTE — ED Notes (Signed)
Phlebotomy able to obtain blue top of second set of blood cultures.  Multiple attempts made prior to start of antibiotics

## 2022-01-06 LAB — CULTURE, BLOOD (ROUTINE X 2): Special Requests: ADEQUATE

## 2022-01-07 ENCOUNTER — Emergency Department (HOSPITAL_COMMUNITY): Payer: Medicare Other

## 2022-01-07 ENCOUNTER — Encounter (HOSPITAL_COMMUNITY): Payer: Self-pay

## 2022-01-07 ENCOUNTER — Telehealth (HOSPITAL_BASED_OUTPATIENT_CLINIC_OR_DEPARTMENT_OTHER): Payer: Self-pay

## 2022-01-07 ENCOUNTER — Inpatient Hospital Stay (HOSPITAL_COMMUNITY)
Admission: EM | Admit: 2022-01-07 | Discharge: 2022-01-10 | DRG: 603 | Disposition: A | Discharge status: Home / Self Care | Payer: Medicare Other | Attending: Internal Medicine | Admitting: Internal Medicine

## 2022-01-07 ENCOUNTER — Other Ambulatory Visit: Payer: Self-pay

## 2022-01-07 DIAGNOSIS — L03113 Cellulitis of right upper limb: Principal | ICD-10-CM | POA: Diagnosis present

## 2022-01-07 DIAGNOSIS — Z79899 Other long term (current) drug therapy: Secondary | ICD-10-CM | POA: Diagnosis not present

## 2022-01-07 DIAGNOSIS — Z91013 Allergy to seafood: Secondary | ICD-10-CM | POA: Diagnosis not present

## 2022-01-07 DIAGNOSIS — E785 Hyperlipidemia, unspecified: Secondary | ICD-10-CM | POA: Diagnosis present

## 2022-01-07 DIAGNOSIS — F1990 Other psychoactive substance use, unspecified, uncomplicated: Secondary | ICD-10-CM | POA: Insufficient documentation

## 2022-01-07 DIAGNOSIS — B182 Chronic viral hepatitis C: Secondary | ICD-10-CM | POA: Diagnosis present

## 2022-01-07 DIAGNOSIS — F17210 Nicotine dependence, cigarettes, uncomplicated: Secondary | ICD-10-CM | POA: Diagnosis present

## 2022-01-07 DIAGNOSIS — F1110 Opioid abuse, uncomplicated: Secondary | ICD-10-CM | POA: Diagnosis present

## 2022-01-07 DIAGNOSIS — L0390 Cellulitis, unspecified: Secondary | ICD-10-CM | POA: Diagnosis present

## 2022-01-07 DIAGNOSIS — E119 Type 2 diabetes mellitus without complications: Secondary | ICD-10-CM

## 2022-01-07 DIAGNOSIS — I10 Essential (primary) hypertension: Secondary | ICD-10-CM | POA: Insufficient documentation

## 2022-01-07 DIAGNOSIS — Z7984 Long term (current) use of oral hypoglycemic drugs: Secondary | ICD-10-CM | POA: Diagnosis not present

## 2022-01-07 LAB — CBC WITH DIFFERENTIAL/PLATELET
Abs Immature Granulocytes: 0.09 10*3/uL — ABNORMAL HIGH (ref 0.00–0.07)
Basophils Absolute: 0 10*3/uL (ref 0.0–0.1)
Basophils Relative: 0 %
Eosinophils Absolute: 0 10*3/uL (ref 0.0–0.5)
Eosinophils Relative: 0 %
HCT: 41.6 % (ref 39.0–52.0)
Hemoglobin: 13.7 g/dL (ref 13.0–17.0)
Immature Granulocytes: 1 %
Lymphocytes Relative: 8 %
Lymphs Abs: 1.3 10*3/uL (ref 0.7–4.0)
MCH: 25.7 pg — ABNORMAL LOW (ref 26.0–34.0)
MCHC: 32.9 g/dL (ref 30.0–36.0)
MCV: 77.9 fL — ABNORMAL LOW (ref 80.0–100.0)
Monocytes Absolute: 1.1 10*3/uL — ABNORMAL HIGH (ref 0.1–1.0)
Monocytes Relative: 7 %
Neutro Abs: 14.2 10*3/uL — ABNORMAL HIGH (ref 1.7–7.7)
Neutrophils Relative %: 84 %
Platelets: 258 10*3/uL (ref 150–400)
RBC: 5.34 MIL/uL (ref 4.22–5.81)
RDW: 13.4 % (ref 11.5–15.5)
WBC: 16.7 10*3/uL — ABNORMAL HIGH (ref 4.0–10.5)
nRBC: 0 % (ref 0.0–0.2)

## 2022-01-07 LAB — COMPREHENSIVE METABOLIC PANEL
ALT: 26 U/L (ref 0–44)
AST: 22 U/L (ref 15–41)
Albumin: 3.2 g/dL — ABNORMAL LOW (ref 3.5–5.0)
Alkaline Phosphatase: 79 U/L (ref 38–126)
Anion gap: 11 (ref 5–15)
BUN: 10 mg/dL (ref 8–23)
CO2: 25 mmol/L (ref 22–32)
Calcium: 9.4 mg/dL (ref 8.9–10.3)
Chloride: 98 mmol/L (ref 98–111)
Creatinine, Ser: 0.98 mg/dL (ref 0.61–1.24)
GFR, Estimated: >60 mL/min (ref >60)
Glucose, Bld: 158 mg/dL — ABNORMAL HIGH (ref 70–99)
Potassium: 3.9 mmol/L (ref 3.5–5.1)
Sodium: 134 mmol/L — ABNORMAL LOW (ref 135–145)
Total Bilirubin: 0.6 mg/dL (ref 0.3–1.2)
Total Protein: 8 g/dL (ref 6.5–8.1)

## 2022-01-07 LAB — LACTIC ACID, PLASMA
Lactic Acid, Venous: 1.3 mmol/L (ref 0.5–1.9)
Lactic Acid, Venous: 1.4 mmol/L (ref 0.5–1.9)

## 2022-01-07 LAB — GLUCOSE, CAPILLARY: Glucose-Capillary: 192 mg/dL — ABNORMAL HIGH (ref 70–99)

## 2022-01-07 MED ORDER — ENOXAPARIN SODIUM 40 MG/0.4ML IJ SOSY
40.0000 mg | PREFILLED_SYRINGE | INTRAMUSCULAR | Status: DC
  Administered 2022-01-07 – 2022-01-09 (×3): 40 mg via SUBCUTANEOUS
  Filled 2022-01-07 (×3): qty 0.4

## 2022-01-07 MED ORDER — KETOROLAC TROMETHAMINE 30 MG/ML IJ SOLN
15.0000 mg | Freq: Once | INTRAMUSCULAR | Status: AC
  Administered 2022-01-07: 15 mg via INTRAVENOUS
  Filled 2022-01-07: qty 1

## 2022-01-07 MED ORDER — INSULIN ASPART 100 UNIT/ML IJ SOLN
0.0000 [IU] | Freq: Three times a day (TID) | INTRAMUSCULAR | Status: DC
  Administered 2022-01-08 – 2022-01-10 (×6): 1 [IU] via SUBCUTANEOUS

## 2022-01-07 MED ORDER — TRAMADOL HCL 50 MG PO TABS
50.0000 mg | ORAL_TABLET | Freq: Two times a day (BID) | ORAL | Status: DC | PRN
  Administered 2022-01-07: 50 mg via ORAL
  Filled 2022-01-07: qty 1

## 2022-01-07 MED ORDER — ATORVASTATIN CALCIUM 10 MG PO TABS
20.0000 mg | ORAL_TABLET | Freq: Every day | ORAL | Status: DC
  Administered 2022-01-08 – 2022-01-10 (×3): 20 mg via ORAL
  Filled 2022-01-07 (×3): qty 2

## 2022-01-07 MED ORDER — IBUPROFEN 200 MG PO TABS
600.0000 mg | ORAL_TABLET | ORAL | Status: DC | PRN
  Administered 2022-01-08: 600 mg via ORAL
  Filled 2022-01-07: qty 3

## 2022-01-07 MED ORDER — ORAL CARE MOUTH RINSE: 15.0000 mL | OROMUCOSAL | Status: DC | PRN

## 2022-01-07 MED ORDER — AMLODIPINE BESYLATE 10 MG PO TABS
10.0000 mg | ORAL_TABLET | Freq: Every day | ORAL | Status: DC
  Administered 2022-01-08 – 2022-01-10 (×3): 10 mg via ORAL
  Filled 2022-01-07 (×3): qty 1

## 2022-01-07 MED ORDER — LORAZEPAM 1 MG PO TABS
1.0000 mg | ORAL_TABLET | ORAL | Status: DC | PRN
  Administered 2022-01-08 (×2): 2 mg via ORAL
  Filled 2022-01-07 (×2): qty 2

## 2022-01-07 MED ORDER — SODIUM CHLORIDE 0.9 % IV SOLN
2.0000 g | Freq: Three times a day (TID) | INTRAVENOUS | Status: DC
  Administered 2022-01-07 – 2022-01-10 (×9): 2 g via INTRAVENOUS
  Filled 2022-01-07 (×10): qty 12.5

## 2022-01-07 MED ORDER — VANCOMYCIN HCL 2000 MG/400ML IV SOLN
2000.0000 mg | Freq: Once | INTRAVENOUS | Status: AC
  Administered 2022-01-07: 2000 mg via INTRAVENOUS
  Filled 2022-01-07: qty 400

## 2022-01-07 MED ORDER — VANCOMYCIN HCL 1250 MG/250ML IV SOLN
1250.0000 mg | Freq: Two times a day (BID) | INTRAVENOUS | Status: DC
  Administered 2022-01-08 – 2022-01-10 (×5): 1250 mg via INTRAVENOUS
  Filled 2022-01-07 (×6): qty 250

## 2022-01-07 MED ORDER — LOSARTAN POTASSIUM 50 MG PO TABS
50.0000 mg | ORAL_TABLET | Freq: Every day | ORAL | Status: DC
  Administered 2022-01-08 – 2022-01-10 (×3): 50 mg via ORAL
  Filled 2022-01-07 (×3): qty 1

## 2022-01-07 MED ORDER — LORAZEPAM 2 MG/ML IJ SOLN
1.0000 mg | INTRAMUSCULAR | Status: DC | PRN
  Administered 2022-01-08 (×2): 4 mg via INTRAVENOUS
  Filled 2022-01-07 (×2): qty 2

## 2022-01-07 NOTE — Assessment & Plan Note (Signed)
-  No recent hemoglobin A1c presented with CBG of 158 -Placed on SSI

## 2022-01-07 NOTE — ED Provider Notes (Signed)
Patient shared visit with PA.  History, physical, medical decision making provided by myself.  Normal vitals.  No fever.  History of IV drug use and diabetes.  Here for reevaluation of right arm wound at IV drug injection site.  He was started on Bactrim a couple days ago but redness and swelling has not gotten better.  He has an outline that was drawn a couple days ago but he was told that the outline was drawn to tell him to come back if redness reached out Lyme but he did not originally outlined his original redness.  Clinically overall this is a fairly extensive cellulitis of the right forearm.  X-ray done per my review and interpretation does not show any signs of foreign body.  But there is significant edema.  Bedside ultrasound formed by my PA does not show any obvious abscess.  Likely phlegmon development over the forearm and significant edema of the forearm musculature.  Redness and swelling does not overlie any joint space and no concern for septic joint.  Patient had a CBC, BMP, IV antibiotics started.  he had a white count of 16.  Overall we will admit him for IV antibiotics and further social support.  No need for any I&D of surgical involvement at this time as this mostly appears to be cellulitic in nature with may be some early phlegmon development of 1 portion of the forearm.  Will admit to hospitalist for further care.  This chart was dictated using voice recognition software.  Despite best efforts to proofread,  errors can occur which can change the documentation meaning.    Lennice Sites, DO 01/07/22 1813

## 2022-01-07 NOTE — Progress Notes (Signed)
ED Antimicrobial Stewardship Positive Culture Follow Up   Adrian Castillo is an 66 y.o. male who presented to [facility] on 01/04/2022 with a chief complaint of redness/swelling to the forearm after IVDU.   Chief Complaint  Patient presents with   Cellulitis    Recent Results (from the past 720 hour(s))  Blood culture (routine x 2)     Status: Abnormal   Collection Time: 01/05/22  5:43 AM   Specimen: BLOOD  Result Value Ref Range Status   Specimen Description BLOOD LEFT ANTECUBITAL  Final   Special Requests   Final    BOTTLES DRAWN AEROBIC AND ANAEROBIC Blood Culture adequate volume   Culture  Setup Time   Final    GRAM POSITIVE RODS AEROBIC BOTTLE ONLY CRITICAL RESULT CALLED TO, READ BACK BY AND VERIFIED WITH:  RN C. OLDLAND 01/05/22 @ 2156 BY AB    Culture (A)  Final    BACILLUS SPECIES Standardized susceptibility testing for this organism is not available. Performed at [facility], [place] [address] [place], [place] [address]    Report Status 01/06/2022 FINAL  Final  Blood culture (routine x 2)     Status: None (Preliminary result)   Collection Time: 01/05/22  6:46 AM   Specimen: BLOOD LEFT HAND  Result Value Ref Range Status   Specimen Description BLOOD LEFT HAND  Final   Special Requests   Final    BOTTLES DRAWN AEROBIC ONLY Blood Culture results may not be optimal due to an inadequate volume of blood received in culture bottles   Culture   Final    NO GROWTH 2 DAYS Performed at [facility], [place] [address] [place], [place] [address]    Report Status PENDING  Incomplete    '[x]'$  Treated with vancomycin during ED admission and discharged on bactrim, blood culture grew bacillus species in 1/2 bottles likely a contaminant.   Antibiotic prescription: Continue on original prescription of bactrim for treatment of cellulitis infection.   ED Provider: Dr. Sherrin Daisy, PharmD PGY1 Pharmacy Resident  01/07/2022 8:51 AM Monday - Friday  phone -  [phone] Saturday - Sunday phone - [phone]

## 2022-01-07 NOTE — Assessment & Plan Note (Signed)
Controlled.  Continue home antihypertensives pending med rec.

## 2022-01-07 NOTE — Assessment & Plan Note (Signed)
-  pt with IV drug use presents with worsening right forearm cellulitis after failed outpatient therapy with Bactrim -Only edema and possible phlegmon seen on bedside ultrasound performed by EDP -continue IV Vancomycin and Cefepime

## 2022-01-07 NOTE — Assessment & Plan Note (Signed)
Reports last heroin use last week.  No current signs of withdrawal. -Keep on CIWA protocol

## 2022-01-07 NOTE — ED Triage Notes (Signed)
Pt arrived POV from home stating he was treated for cellulitis on 11/4 in his right arm but now it has gotten worse and the redness and swelling are spreading.

## 2022-01-07 NOTE — ED Notes (Signed)
Unable draw second lactic acid, antibiotic will be initiated.

## 2022-01-07 NOTE — ED Provider Triage Note (Signed)
Emergency Medicine Provider Triage Evaluation Note  Adrian Castillo , a 67 y.o. male  was evaluated in triage.  Pt complains of worsening cellulitis to the right arm.  Seen in the ED 3 days ago for same.  Started on Bactrim BID, however does not believe this is working.  Now with worsening ROM and strength of right upper extremity.  Endorses chills and possible subjective fevers at home.  Hx of IVDU  Review of Systems  Positive:  Negative: See above  Physical Exam  BP 120/89 (BP Location: Left Arm)   Pulse 75   Temp 99.3 F (37.4 C)   Resp 20   Ht '5\' 11"'$  (1.803 m)   Wt 88.5 kg   SpO2 99%   BMI 27.20 kg/m  Gen:   Awake, no distress, appears uncomfortable Resp:  Normal effort, equal chest rise MSK:   Moves extremities without difficulty  Other:  No obvious cardiac murmur appreciated.  See photos.  Sensation grossly intact of right upper extremity.  Induration without fluctuance appreciated.         Medical Decision Making  Medically screening exam initiated at 2:00 PM.  Appropriate orders placed.  Bocephus Cali was informed that the remainder of the evaluation will be completed by another provider, this initial triage assessment does not replace that evaluation, and the importance of remaining in the ED until their evaluation is complete.     Prince Rome, PA-C 16/10/96 1404

## 2022-01-07 NOTE — Progress Notes (Signed)
Pharmacy Antibiotic Note  Adrian Castillo is a 66 y.o. male admitted on 01/07/2022 with  cellulitis .  Pharmacy has been consulted for vancomycin and cefepime dosing.  Patient presenting with worsening cellulitis in right arm. Recently treated in ED on 11/14 for same complaint, given vancomycin and discharged on Bactrim. Has history of IVDU. On presentation, patient is afebrile with WBC of 13.2. Team would like to start broad spectrum antibiotics.  Plan: Give vancomycin '2000mg'$  IV x1, then '1250mg'$  IV q12 hours (Scr 0.89, eAUC 512, Vd 0.72) Give cefepime 2g IV q8 hours Monitor clinical status, renal function, culture results, and LOT  Height: '5\' 11"'$  (180.3 cm) Weight: 88.5 kg (195 lb) IBW/kg (Calculated) : 75.3  Temp (24hrs), Avg:99.1 F (37.3 C), Min:98.8 F (37.1 C), Max:99.3 F (37.4 C)  Recent Labs  Lab 01/05/22 0543 01/05/22 0544  WBC 13.2*  --   CREATININE 0.89  --   LATICACIDVEN  --  1.2    Estimated Creatinine Clearance: 87 mL/min (by C-G formula based on SCr of 0.89 mg/dL).    Allergies  Allergen Reactions   Crab [Shellfish Allergy] Hives    Soft crab (receives TOO MUCH iodine) eats hard-shell crap with no issues   Tylenol [Acetaminophen] Hives    Antimicrobials this admission: Cefepime 11/17 >> Vancomcyin 11/17 >>  Dose adjustments this admission: N/A  Microbiology results: 11/17 Bcx:  Thank you for allowing pharmacy to be a part of this patient's care.  Louanne Belton, PharmD, [facility] PGY1 Pharmacy Resident 01/07/2022 4:49 PM

## 2022-01-07 NOTE — ED Provider Notes (Signed)
[facility] EMERGENCY DEPARTMENT Provider Note   CSN: [id number] Arrival date & time: 01/07/22  1309     History  Chief Complaint  Patient presents with   Arm Pain    Infection (cellulitis)     Nyle Limb is a 66 y.o. male with Hx of IVDU and DMT2 presenting to the ED due to worsening right arm infection.  Seen in the ED 3 days ago for same.  Without evidence of sepsis or significant traveling infection at that time.  Started on Bactrim BID.  Patient states infection has continued to spread, now has worsening ROM, and new subjective fevers.  States the entire hand and arm are painful.  Denies N/V/D, chest pain, shortness of breath, headache, or neck stiffness.  Per record review, symptoms started on 01/03/2022.  The history is provided by the patient and medical records.  Arm Pain      Home Medications Prior to Admission medications   Medication Sig Start Date End Date Taking? Authorizing Provider  albuterol (VENTOLIN HFA) 108 (90 Base) MCG/ACT inhaler Inhale 2 puffs into the lungs every 4 (four) hours as needed for wheezing or shortness of breath. 12/15/20   Camillia Herter, NP  amLODipine (NORVASC) 10 MG tablet Take 1 tablet (10 mg total) by mouth daily. 12/15/20 03/24/21  Camillia Herter, NP  glipiZIDE (GLUCOTROL) 10 MG tablet Take 1 tablet (10 mg total) by mouth 2 (two) times daily before a meal. 12/15/20 03/24/21  Camillia Herter, NP  hydrochlorothiazide (HYDRODIURIL) 25 MG tablet Take 1 tablet (25 mg total) by mouth daily. 12/15/20 03/15/21  Camillia Herter, NP  ibuprofen (ADVIL) 800 MG tablet Take 1 tablet (800 mg total) by mouth every 8 (eight) hours as needed (for pain). 12/15/20   Camillia Herter, NP  losartan (COZAAR) 25 MG tablet Take 0.5 tablets (12.5 mg total) by mouth daily. 12/15/20 03/24/21  Camillia Herter, NP  naloxone [facility]) nasal spray 4 mg/0.1 mL Use one spray in the nostril in the case of an overdose Patient not taking: No sig reported 03/31/20    Couture, Cortni S, PA-C  potassium chloride SA (K-DUR,KLOR-CON) 20 MEQ tablet Take 2 tablets (40 mEq total) by mouth 2 (two) times daily. Patient taking differently: Take 20 mEq by mouth daily as needed (for leg cramps).  04/10/16 10/03/17  Long, Wonda Olds, MD  sulfamethoxazole-trimethoprim (BACTRIM DS) 800-160 MG tablet Take 1 tablet by mouth 2 (two) times daily for 10 days. 01/05/22 01/15/22  Sponseller, Gypsy Balsam, PA-C      Allergies    Crab [shellfish allergy] and Tylenol [acetaminophen]    Review of Systems   Review of Systems  Skin:  Positive for wound.       Infection right arm, worsening    Physical Exam Updated Vital Signs BP 136/87   Pulse 80   Temp 98.8 F (37.1 C) (Oral)   Resp 17   Ht '5\' 11"'$  (1.803 m)   Wt 88.5 kg   SpO2 100%   BMI 27.20 kg/m  Physical Exam Vitals and nursing note reviewed.  Constitutional:      General: He is not in acute distress.    Appearance: He is well-developed. He is not ill-appearing, toxic-appearing or diaphoretic.  HENT:     Head: Normocephalic and atraumatic.  Eyes:     General: No scleral icterus.    Conjunctiva/sclera: Conjunctivae normal.  Neck:     Comments: No meningismus or torticollis Cardiovascular:  Rate and Rhythm: Normal rate and regular rhythm.     Pulses: Normal pulses.     Heart sounds: Normal heart sounds. No murmur heard.    Comments: CRT less than 2. Radial pulses 2+ bilaterally. Pulmonary:     Effort: Pulmonary effort is normal. No respiratory distress.     Breath sounds: Normal breath sounds.  Abdominal:     Palpations: Abdomen is soft.     Tenderness: There is no abdominal tenderness.  Musculoskeletal:        General: No swelling.     Cervical back: Neck supple. No rigidity.     Comments: Reduced ROM of right upper extremity, specifically from the elbow to the fingertips.  Appears to elicit pain with ROM.  Skin:    General: Skin is warm and dry.     Capillary Refill: Capillary refill takes less than  2 seconds.     Findings: Erythema present.     Comments: Significant redness, swelling, and tenderness with taut shiny skin of the right upper extremity.  See photos  Neurological:     Mental Status: He is alert and oriented to person, place, and time.     Sensory: No sensory deficit.     Comments: Sensation still grossly intact of right and left upper extremities.  Psychiatric:        Mood and Affect: Mood normal.           ED Results / Procedures / Treatments   Labs (all labs ordered are listed, but only abnormal results are displayed) Labs Reviewed  COMPREHENSIVE METABOLIC PANEL - Abnormal; Notable for the following components:      Result Value   Sodium 134 (*)    Glucose, Bld 158 (*)    Albumin 3.2 (*)    All other components within normal limits  CBC WITH DIFFERENTIAL/PLATELET - Abnormal; Notable for the following components:   WBC 16.7 (*)    MCV 77.9 (*)    MCH 25.7 (*)    Neutro Abs 14.2 (*)    Monocytes Absolute 1.1 (*)    Abs Immature Granulocytes 0.09 (*)    All other components within normal limits  CULTURE, BLOOD (ROUTINE X 2)  CULTURE, BLOOD (ROUTINE X 2)  LACTIC ACID, PLASMA  LACTIC ACID, PLASMA    EKG None  Radiology DG Forearm Right  Result Date: 01/07/2022 CLINICAL DATA:  RIGHT arm pain. EXAM: RIGHT FOREARM - 2 VIEW COMPARISON:  01/04/2022 FINDINGS: No fracture of the radius or ulna. The elbow joint and the wrist joint appear normal on two views. Soft tissue swelling over the distal forearm unchanged from comparison exam. No subcutaneous gas. No foreign body. IMPRESSION: 1. No change in soft tissue swelling of the distal forearm. 2. No osseous abnormality. 3. No foreign body. Electronically Signed   By: Suzy Bouchard M.D.   On: 01/07/2022 17:04    Procedures Procedures    Medications Ordered in ED Medications  vancomycin (VANCOREADY) IVPB 2000 mg/400 mL (has no administration in time range)    Followed by  vancomycin (VANCOREADY) IVPB  1250 mg/250 mL (has no administration in time range)  ceFEPIme (MAXIPIME) 2 g in sodium chloride 0.9 % 100 mL IVPB (0 g Intravenous Stopped 01/07/22 1833)    ED Course/ Medical Decision Making/ A&P Clinical Course as of 01/07/22 1837  Fri Jan 07, 2022  1634 Consulted with Raquel Sarna of pharmacy via phone and secure chat for optimal antibiotic coverage. Recommended Cefepime + Vanc given hx of  IVDU. Believes Vanc + Zosyn would likely be too strenuous on kidneys.  May consider adding on metronidazole if needed. [AC]  1808 WBC(!): 16.7 [AC]  1837 Lactic acid, plasma [AC]    Clinical Course User Index [AC] Prince Rome, PA-C                           Medical Decision Making Amount and/or Complexity of Data Reviewed Labs: ordered. Decision-making details documented in ED Course. Radiology: ordered.  Risk Prescription drug management. Decision regarding hospitalization.   65 y.o. male presents to the ED for concern of Arm Pain (Infection (cellulitis)/)   This involves an extensive number of treatment options, and is a complaint that carries with it a high risk of complications and morbidity.  The emergent differential diagnosis prior to evaluation includes, but is not limited to: cellulitis, sepsis, abscess, osteomyelitis  This is not an exhaustive differential.   Past Medical History / Co-morbidities / Social History: Hx of IVDU, DMT2, HTN, Hep C Social Determinants of Health include: IVDU, cessation counseling provided  Additional History:  Obtained by chart review.  Notably recent ED visit, see for details  Lab Tests: I ordered, and personally interpreted labs.  The pertinent results include:   WBC 16.7 with leukocytosis.  Normal H&H Sodium 134, glucose 158, albumin 3.2 Lactic acid Blood cultures pending  Imaging Studies: I ordered imaging studies including XR right forearm.   I independently visualized and interpreted imaging which showed no evidence of foreign body or  osseous abnormality I agree with the radiologist interpretation.  ED Course: Pt uncomfortable-appearing on exam.  Nonseptic, nontoxic appearing in NAD.  AAOx4.  Returning to the ED with worsening infection of right forearm.  Seen for this 3 days ago, see note for details.  Has been taking Bactrim BID, with continued worsening of wound.  Denies discharge from the area.  Now with worsening ROM of right digits and wrist.  Endorses subjective fever at home, however afebrile today.  Is not tachycardic or tachypneic.  Skin on exam appears taut, shiny, warm, erythematous, and significantly tender.  With significant risk factors such as IVDU and DMT2.  Consulted with pharmacy on optimal antibiotic approach, was recommended to begin both cefepime and vancomycin.  May consider adding Flagyl as needed.  Labs and repeat X-ray pending.  Anticipate admission. Utilized [place] for imaging to assess for abscess.  No obvious abscess seen by this provider that appears amenable to drainage.  Wound outlined today for current borders at this point in time.  May use this as reference to track cellulitis progression. Elevated WBC 16.7 with leukocytosis.  Again considering pt risks, plan to proceed with admission.   Attending Dr. Ronnald Nian consulted with hospitalist team regarding admission, see note for details.  Hospitalist team accepted pt for admisison.   Disposition: Admission  I discussed this case with attending physician Dr. Ronnald Nian, who agreed with the proposed treatment course and cosigned this note.  Attending physician stated agreement with plan or made changes to plan which were implemented.     This chart was dictated using voice recognition software.  Despite best efforts to proofread, errors can occur which can change the documentation meaning.         Final Clinical Impression(s) / ED Diagnoses Final diagnoses:  Cellulitis of right upper extremity    Rx / DC Orders ED Discharge Orders     None  Prince Rome, PA-C 45/62/56 [id number]    Lennice Sites, DO 01/07/22 2135

## 2022-01-07 NOTE — ED Notes (Signed)
Tried to get patient blood we had no success. The Nurse was informed.

## 2022-01-07 NOTE — H&P (Signed)
History and Physical    Patient: Adrian Castillo CWC:[id number] DOB: Jun 01, 1955 DOA: 01/07/2022 DOS: the patient was seen and examined on 01/07/2022 PCP: Camillia Herter, NP  Patient coming from: Home  Chief Complaint:  Chief Complaint  Patient presents with   Arm Pain    Infection (cellulitis)    HPI: Adrian Castillo is a 66 y.o. male with medical history significant of hypertension, chronic hepatitis C, type 2 diabetes, history of IV heroin drug use who presents with worsening right forearm cellulitis.  Reports last heroin use last week and injected it to his right forearm.  Then 5 days ago he began to note increasing redness and erythema.  He presented to the ED on 01/05/2022 and was given a dose of IV vancomycin and discharged home on Bactrim which she has been compliant with.  However, erythema continues to spread prompting him to present to the ED today.  He denies any fever, no nausea or vomiting. Denies alcohol or any other illicit drug use.  In the ED, temperature 99.5, BP of 120/89 on room air.  WBC of 16.7, hemoglobin of 13.7, lactate within normal limits of 1.3.  Sodium of 134, K of 3.9, creatinine of 0.98, CBG of 158.  Right upper extremity x-rays show no changes in soft tissue swelling compared to prior.  No foreign body.  ED physician performed bedside ultrasound and saw edema and possible developing phlegmon but no clear drainage abscess.   He was started on IV vancomycin and cefepime in the ED. Hospitalist then consulted for admission. Review of Systems: As mentioned in the history of present illness. All other systems reviewed and are negative. Past Medical History:  Diagnosis Date   Diabetes mellitus without complication ([place])    Hep C w/o coma, chronic ([place])    Heroin addiction ([place])    Hypertension    History reviewed. No pertinent surgical history. Social History:  reports that he has been smoking cigarettes. He has been smoking an average of .5 packs per day. He  has never used smokeless tobacco. He reports that he does not drink alcohol and does not use drugs.  Allergies  Allergen Reactions   Crab [Shellfish Allergy] Hives    Soft crab (receives TOO MUCH iodine) eats hard-shell crap with no issues   Tylenol [Acetaminophen] Hives    History reviewed. No pertinent family history.  Prior to Admission medications   Medication Sig Start Date End Date Taking? Authorizing Provider  albuterol (VENTOLIN HFA) 108 (90 Base) MCG/ACT inhaler Inhale 2 puffs into the lungs every 4 (four) hours as needed for wheezing or shortness of breath. 12/15/20   Camillia Herter, NP  amLODipine (NORVASC) 10 MG tablet Take 1 tablet (10 mg total) by mouth daily. 12/15/20 03/24/21  Camillia Herter, NP  glipiZIDE (GLUCOTROL) 10 MG tablet Take 1 tablet (10 mg total) by mouth 2 (two) times daily before a meal. 12/15/20 03/24/21  Camillia Herter, NP  hydrochlorothiazide (HYDRODIURIL) 25 MG tablet Take 1 tablet (25 mg total) by mouth daily. 12/15/20 03/15/21  Camillia Herter, NP  ibuprofen (ADVIL) 800 MG tablet Take 1 tablet (800 mg total) by mouth every 8 (eight) hours as needed (for pain). 12/15/20   Camillia Herter, NP  losartan (COZAAR) 25 MG tablet Take 0.5 tablets (12.5 mg total) by mouth daily. 12/15/20 03/24/21  Camillia Herter, NP  naloxone [facility]) nasal spray 4 mg/0.1 mL Use one spray in the nostril in the case of an overdose Patient  not taking: No sig reported 03/31/20   Couture, Cortni S, PA-C  potassium chloride SA (K-DUR,KLOR-CON) 20 MEQ tablet Take 2 tablets (40 mEq total) by mouth 2 (two) times daily. Patient taking differently: Take 20 mEq by mouth daily as needed (for leg cramps).  04/10/16 10/03/17  Long, Wonda Olds, MD  sulfamethoxazole-trimethoprim (BACTRIM DS) 800-160 MG tablet Take 1 tablet by mouth 2 (two) times daily for 10 days. 01/05/22 01/15/22  Sponseller, Gypsy Balsam, PA-C    Physical Exam: Vitals:   01/07/22 1944 01/07/22 2045 01/07/22 2115 01/07/22 2139  BP:    (!) 154/88 (!) 142/86  Pulse:  71 81 63  Resp:  '15 18 18  '$ Temp: 99.4 F (37.4 C)  99.5 F (37.5 C) 98.8 F (37.1 C)  TempSrc: Oral  Oral Oral  SpO2:  96% 98% 96%  Weight:    79.1 kg  Height:       Constitutional: NAD, calm, comfortable, drowsy appearing male appearing younger than stated age laying in bed asleep Eyes:  lids and conjunctivae normal ENMT: Mucous membranes are moist.  Neck: normal, supple Respiratory: clear to auscultation bilaterally, no wheezing, no crackles. Normal respiratory effort. No accessory muscle use.  Cardiovascular: Regular rate and rhythm, no murmurs / rubs / gallops. No extremity edema. Abdomen: no tenderness, no masses palpated. No hepatosplenomegaly. Bowel sounds positive.  Musculoskeletal: no clubbing / cyanosis. No joint deformity upper and lower extremities. Good ROM, no contractures. Normal muscle tone.  Skin: Large central indurated area without any fluctuance with surrounding spreading erythema on right forearm Neurologic: CN 2-12 grossly intact.  Strength 5/5 in all 4.  Psychiatric: Normal judgment and insight. Alert and oriented x 3. Normal mood. Data Reviewed:  See HPI  Assessment and Plan: * Cellulitis -pt with IV drug use presents with worsening right forearm cellulitis after failed outpatient therapy with Bactrim -Only edema and possible phlegmon seen on bedside ultrasound performed by EDP -continue IV Vancomycin and Cefepime  Type 2 diabetes mellitus (HCC) -No recent hemoglobin A1c presented with CBG of 158 -Placed on SSI  HTN (hypertension) Controlled.  Continue home antihypertensives pending med rec.  IVDU (intravenous drug user) Reports last heroin use last week.  No current signs of withdrawal. -Keep on CIWA protocol      Advance Care Planning:   Code Status: Full Code   Consults: none  Family Communication: none at bedside  Severity of Illness: The appropriate patient status for this patient is INPATIENT. Inpatient  status is judged to be reasonable and necessary in order to provide the required intensity of service to ensure the patient's safety. The patient's presenting symptoms, physical exam findings, and initial radiographic and laboratory data in the context of their chronic comorbidities is felt to place them at high risk for further clinical deterioration. Furthermore, it is not anticipated that the patient will be medically stable for discharge from the hospital within 2 midnights of admission.   * I certify that at the point of admission it is my clinical judgment that the patient will require inpatient hospital care spanning beyond 2 midnights from the point of admission due to high intensity of service, high risk for further deterioration and high frequency of surveillance required.*  Author: Orene Desanctis, DO 01/07/2022 9:45 PM  For on call review [email or web address].

## 2022-01-07 NOTE — ED Notes (Signed)
This EMT called the patient for updated vitals but the patient did not answer.

## 2022-01-07 NOTE — Telephone Encounter (Signed)
Post ED Visit - Positive Culture Follow-up  Culture report reviewed by antimicrobial stewardship pharmacist: [place] Team '[x]'$  Gena Fray, Pharm.D. '[]'$  Heide Guile, Pharm.D., BCPS AQ-ID '[]'$  Parks Neptune, Pharm.D., BCPS '[]'$  Alycia Rossetti, Pharm.D., BCPS '[]'$  [place], Pharm.D., BCPS, AAHIVP '[]'$  Legrand Como, Pharm.D., BCPS, AAHIVP '[]'$  Salome Arnt, PharmD, BCPS '[]'$  Johnnette Gourd, PharmD, BCPS '[]'$  Hughes Better, PharmD, BCPS '[]'$  Leeroy Cha, PharmD '[]'$  Laqueta Linden, PharmD, BCPS '[]'$  Albertina Parr, PharmD  [place] Team '[]'$  Leodis Sias, PharmD '[]'$  Lindell Spar, PharmD '[]'$  Royetta Asal, PharmD '[]'$  Graylin Shiver, Rph '[]'$  Rema Fendt) Glennon Mac, PharmD '[]'$  Arlyn Dunning, PharmD '[]'$  Netta Cedars, PharmD '[]'$  Dia Sitter, PharmD '[]'$  Leone Haven, PharmD '[]'$  Gretta Arab, PharmD '[]'$  Theodis Shove, PharmD '[]'$  Peggyann Juba, PharmD '[]'$  Reuel Boom, PharmD   Positive blood culture Treated with Sulfamethoxazole-Trimethoprim, organism sensitive to the same and no further patient follow-up is required at this time.  Glennon Hamilton 01/07/2022, 10:18 AM

## 2022-01-08 DIAGNOSIS — L03113 Cellulitis of right upper limb: Secondary | ICD-10-CM | POA: Diagnosis not present

## 2022-01-08 LAB — CBC
HCT: 40.9 % (ref 39.0–52.0)
Hemoglobin: 13.4 g/dL (ref 13.0–17.0)
MCH: 25.2 pg — ABNORMAL LOW (ref 26.0–34.0)
MCHC: 32.8 g/dL (ref 30.0–36.0)
MCV: 77 fL — ABNORMAL LOW (ref 80.0–100.0)
Platelets: 266 10*3/uL (ref 150–400)
RBC: 5.31 MIL/uL (ref 4.22–5.81)
RDW: 13.3 % (ref 11.5–15.5)
WBC: 15.4 10*3/uL — ABNORMAL HIGH (ref 4.0–10.5)
nRBC: 0 % (ref 0.0–0.2)

## 2022-01-08 LAB — BASIC METABOLIC PANEL
Anion gap: 13 (ref 5–15)
BUN: 13 mg/dL (ref 8–23)
CO2: 20 mmol/L — ABNORMAL LOW (ref 22–32)
Calcium: 8.9 mg/dL (ref 8.9–10.3)
Chloride: 102 mmol/L (ref 98–111)
Creatinine, Ser: 0.79 mg/dL (ref 0.61–1.24)
GFR, Estimated: >60 mL/min (ref >60)
Glucose, Bld: 130 mg/dL — ABNORMAL HIGH (ref 70–99)
Potassium: 4.6 mmol/L (ref 3.5–5.1)
Sodium: 135 mmol/L (ref 135–145)

## 2022-01-08 LAB — HIV ANTIBODY (ROUTINE TESTING W REFLEX): HIV Screen 4th Generation wRfx: NONREACTIVE

## 2022-01-08 LAB — GLUCOSE, CAPILLARY
Glucose-Capillary: 139 mg/dL — ABNORMAL HIGH (ref 70–99)
Glucose-Capillary: 138 mg/dL — ABNORMAL HIGH (ref 70–99)
Glucose-Capillary: 157 mg/dL — ABNORMAL HIGH (ref 70–99)

## 2022-01-08 MED ORDER — ONDANSETRON HCL 4 MG/2ML IJ SOLN: 4.0000 mg | Freq: Four times a day (QID) | INTRAMUSCULAR | Status: DC | PRN

## 2022-01-08 MED ORDER — HYDRALAZINE HCL 20 MG/ML IJ SOLN: 10.0000 mg | INTRAMUSCULAR | Status: DC | PRN

## 2022-01-08 MED ORDER — TRAZODONE HCL 50 MG PO TABS
50.0000 mg | ORAL_TABLET | Freq: Every evening | ORAL | Status: DC | PRN
  Administered 2022-01-09: 50 mg via ORAL
  Filled 2022-01-08: qty 1

## 2022-01-08 MED ORDER — ACETAMINOPHEN 325 MG PO TABS: 650.0000 mg | ORAL_TABLET | Freq: Four times a day (QID) | ORAL | Status: DC | PRN

## 2022-01-08 MED ORDER — SENNOSIDES-DOCUSATE SODIUM 8.6-50 MG PO TABS: 1.0000 | ORAL_TABLET | Freq: Every evening | ORAL | Status: DC | PRN

## 2022-01-08 MED ORDER — TRAMADOL HCL 50 MG PO TABS
50.0000 mg | ORAL_TABLET | Freq: Three times a day (TID) | ORAL | Status: DC | PRN
  Administered 2022-01-09 – 2022-01-10 (×2): 50 mg via ORAL
  Filled 2022-01-08 (×2): qty 1

## 2022-01-08 MED ORDER — IBUPROFEN 200 MG PO TABS
600.0000 mg | ORAL_TABLET | Freq: Four times a day (QID) | ORAL | Status: DC | PRN
  Administered 2022-01-10: 600 mg via ORAL
  Filled 2022-01-08: qty 3

## 2022-01-08 MED ORDER — IPRATROPIUM-ALBUTEROL 0.5-2.5 (3) MG/3ML IN SOLN: 3.0000 mL | RESPIRATORY_TRACT | Status: DC | PRN

## 2022-01-08 MED ORDER — METOPROLOL TARTRATE 5 MG/5ML IV SOLN: 5.0000 mg | INTRAVENOUS | Status: DC | PRN

## 2022-01-08 NOTE — Plan of Care (Signed)
  Problem: Coping: Goal: Ability to adjust to condition or change in health will improve Outcome: Not Progressing   Problem: Health Behavior/Discharge Planning: Goal: Ability to identify and utilize available resources and services will improve Outcome: Not Progressing   Problem: Health Behavior/Discharge Planning: Goal: Ability to manage health-related needs will improve Outcome: Not Progressing

## 2022-01-08 NOTE — Progress Notes (Signed)
Pt refusing AM labs.  States they couldn't get blood downstairs (ED).  Attempted to educated pt on need for blood work, but still refusing at this time.  Will relay to oncoming shift.

## 2022-01-08 NOTE — Progress Notes (Signed)
Pt irritable and walked off unit with partner, pt stated he's hot and needed fresh air and wanted to sit outside for a while. RN explained to pt he could ambulate of unit but can't stay outside too long with pt. Pt wanted to stay for about 15 mins and when RN told him she couldn't leave her other patients for that long, pt stated he will leave AMA to stay outside till whenever he decided to come back in and then will go back to the ED to be readmitted. Pt assessed to exhibit withdrawal symptoms as he was irritable, agitated and sweating. Security was called to assist to transport pt back to the unit as pt was unsteady with ambulation. CIWA completed and pt given 4 mg ativan, put in bed with bed alarm on and call light within reach, VS stable and MD notified. Will continue to monitor pt. Delia Heady RN   01/08/22 1356  Vitals  Temp 98.3 F (36.8 C)  BP 126/82  BP Location Left Arm  BP Method Automatic  Patient Position (if appropriate) Lying  Pulse Rate 89  Pulse Rate Source Monitor  MEWS COLOR  MEWS Score Color Green  Oxygen Therapy  SpO2 100 %  O2 Device Nasal Cannula  MEWS Score  MEWS Temp 0  MEWS Systolic 0  MEWS Pulse 0  MEWS RR 0  MEWS LOC 0  MEWS Score 0

## 2022-01-08 NOTE — Progress Notes (Signed)
Attempted to reach patient on cell phone to complete substance abuse consult - message left with RNCM contact information. Unable to connect with hospital phone. Noted in chart review that patient had previously received services from [facility]. TOC to continue to follow.   Manya Silvas, RN MSN CCM Transitions of Care - [place] [phone]

## 2022-01-08 NOTE — Plan of Care (Signed)
  Problem: Education: Goal: Ability to describe self-care measures that may prevent or decrease complications (Diabetes Survival Skills Education) will improve Outcome: Not Progressing Goal: Individualized Educational Video(s) Outcome: Not Progressing   Problem: Coping: Goal: Ability to adjust to condition or change in health will improve Outcome: Not Progressing   Problem: Fluid Volume: Goal: Ability to maintain a balanced intake and output will improve Outcome: Not Progressing   Problem: Health Behavior/Discharge Planning: Goal: Ability to identify and utilize available resources and services will improve Outcome: Not Progressing Goal: Ability to manage health-related needs will improve Outcome: Not Progressing   Problem: Metabolic: Goal: Ability to maintain appropriate glucose levels will improve Outcome: Not Progressing   Problem: Nutritional: Goal: Maintenance of adequate nutrition will improve Outcome: Not Progressing Goal: Progress toward achieving an optimal weight will improve Outcome: Not Progressing   Problem: Skin Integrity: Goal: Risk for impaired skin integrity will decrease Outcome: Not Progressing   Problem: Tissue Perfusion: Goal: Adequacy of tissue perfusion will improve Outcome: Not Progressing   Problem: Education: Goal: Knowledge of General Education information will improve Description: Including pain rating scale, medication(s)/side effects and non-pharmacologic comfort measures Outcome: Not Progressing   Problem: Health Behavior/Discharge Planning: Goal: Ability to manage health-related needs will improve Outcome: Not Progressing   Problem: Clinical Measurements: Goal: Ability to maintain clinical measurements within normal limits will improve Outcome: Not Progressing Goal: Will remain free from infection Outcome: Not Progressing Goal: Diagnostic test results will improve Outcome: Not Progressing Goal: Respiratory complications will  improve Outcome: Not Progressing Goal: Cardiovascular complication will be avoided Outcome: Not Progressing   Problem: Activity: Goal: Risk for activity intolerance will decrease Outcome: Not Progressing   Problem: Nutrition: Goal: Adequate nutrition will be maintained Outcome: Not Progressing   Problem: Coping: Goal: Level of anxiety will decrease Outcome: Not Progressing   Problem: Elimination: Goal: Will not experience complications related to bowel motility Outcome: Not Progressing Goal: Will not experience complications related to urinary retention Outcome: Not Progressing   Problem: Pain Managment: Goal: General experience of comfort will improve Outcome: Not Progressing   Problem: Safety: Goal: Ability to remain free from injury will improve Outcome: Not Progressing   Problem: Skin Integrity: Goal: Risk for impaired skin integrity will decrease Outcome: Not Progressing   Problem: Safety: Goal: Non-violent Restraint(s) Outcome: Not Progressing   Problem: Safety: Goal: Non-violent Restraint(s) Outcome: Not Progressing

## 2022-01-08 NOTE — Progress Notes (Signed)
PROGRESS NOTE    Adrian Castillo  VVO:[id number] DOB: [birth year] DOA: 01/07/2022 PCP: Camillia Herter, NP   Brief Narrative:   67 y.o. male with medical history significant of hypertension, chronic hepatitis C, type 2 diabetes, history of IV heroin drug use who presents with worsening right forearm cellulitis. Reports last heroin use last week and injected it to his right forearm and his symptoms started 5 days prior to admission.  He came to the ED on 11/15 and eventually discharged on Bactrim but due to worsening of the symptoms he came back to the ER.  Right upper extremity x-ray shows no further changes and soft tissue swelling.  Patient was started on IV vancomycin and cefepime.   Assessment & Plan:  Principal Problem:   Cellulitis Active Problems:   IVDU (intravenous drug user)   HTN (hypertension)   Type 2 diabetes mellitus (HCC)     Assessment and Plan: * Cellulitis of right upper extremity without purulent discharge/abscess -Secondary to IV drug abuse.  Symptoms continue to worsen despite of taking oral Bactrim at home.  No evidence of abscess/purulent discharge or foreign bodies on the x-ray.  We will continue IV vancomycin and cefepime, if worsens we will have to obtain further imaging.  Type 2 diabetes mellitus (HCC) -No recent hemoglobin A1c presented with CBG of 158 On sliding scale and Accu-Cheks  HTN (hypertension) IV as needed ordered Cont Norvasc, Losartan  Hyperlipidemia -Statin.   IVDU (intravenous drug user) Reports using heroin, counseled to quit using this    DVT prophylaxis: Lovenox Code Status: Full Code Family Communication:    Status is: Inpatient Remains inpatient appropriate because: [facility] stay for IV Abx, need to monitor Bcx as well.    Nutritional status          Body mass index is 24.32 kg/m.         Subjective: No complaints, overall feeling little better   Examination:  General exam: Appears calm and comfortable   Respiratory system: Clear to auscultation. Respiratory effort normal. Cardiovascular system: S1 & S2 heard, RRR. No JVD, murmurs, rubs, gallops or clicks. No pedal edema. Gastrointestinal system: Abdomen is nondistended, soft and nontender. No organomegaly or masses felt. Normal bowel sounds heard. Central nervous system: Alert and oriented. No focal neurological deficits. Extremities: Symmetric 5 x 5 power. Skin: Right upper extremity dressing in place Psychiatry: Judgement and insight appear normal. Mood & affect appropriate.     Objective: Vitals:   01/07/22 2354 01/08/22 0345 01/08/22 0726 01/08/22 0813  BP: (!) 152/95 (!) 145/78 (!) 150/77 (!) 141/91  Pulse: 61 70  63  Resp: '20 20  17  '$ Temp: 98.7 F (37.1 C) 98.4 F (36.9 C)  98.5 F (36.9 C)  TempSrc: Oral Oral  Oral  SpO2: 95% 97%  93%  Weight:      Height:        Intake/Output Summary (Last 24 hours) at 01/08/2022 0827 Last data filed at 01/08/2022 0300 Gross per 24 hour  Intake 615 ml  Output 350 ml  Net 265 ml   Filed Weights   01/07/22 1400 01/07/22 2139  Weight: 88.5 kg 79.1 kg     Data Reviewed:   CBC: Recent Labs  Lab 01/05/22 0543 01/07/22 1719  WBC 13.2* 16.7*  NEUTROABS 10.5* 14.2*  HGB 13.7 13.7  HCT 42.2 41.6  MCV 79.2* 77.9*  PLT 215 269   Basic Metabolic Panel: Recent Labs  Lab 01/05/22 0543 01/07/22 1719  NA 133*  134*  K 4.2 3.9  CL 94* 98  CO2 28 25  GLUCOSE 208* 158*  BUN 8 10  CREATININE 0.89 0.98  CALCIUM 9.2 9.4   GFR: Estimated Creatinine Clearance: 79 mL/min (by C-G formula based on SCr of 0.98 mg/dL). Liver Function Tests: Recent Labs  Lab 01/05/22 0543 01/07/22 1719  AST 19 22  ALT 15 26  ALKPHOS 60 79  BILITOT 0.7 0.6  PROT 7.9 8.0  ALBUMIN 3.4* 3.2*   No results for input(s): "LIPASE", "AMYLASE" in the last 168 hours. No results for input(s): "AMMONIA" in the last 168 hours. Coagulation Profile: No results for input(s): "INR", "PROTIME" in the last  168 hours. Cardiac Enzymes: No results for input(s): "CKTOTAL", "CKMB", "CKMBINDEX", "TROPONINI" in the last 168 hours. BNP (last 3 results) No results for input(s): "PROBNP" in the last 8760 hours. HbA1C: No results for input(s): "HGBA1C" in the last 72 hours. CBG: Recent Labs  Lab 01/07/22 2144  GLUCAP 192*   Lipid Profile: No results for input(s): "CHOL", "HDL", "LDLCALC", "TRIG", "CHOLHDL", "LDLDIRECT" in the last 72 hours. Thyroid Function Tests: No results for input(s): "TSH", "T4TOTAL", "FREET4", "T3FREE", "THYROIDAB" in the last 72 hours. Anemia Panel: No results for input(s): "VITAMINB12", "FOLATE", "FERRITIN", "TIBC", "IRON", "RETICCTPCT" in the last 72 hours. Sepsis Labs: Recent Labs  Lab 01/05/22 0544 01/07/22 1719 01/07/22 1840  LATICACIDVEN 1.2 1.3 1.4    Recent Results (from the past 240 hour(s))  Blood culture (routine x 2)     Status: Abnormal   Collection Time: 01/05/22  5:43 AM   Specimen: BLOOD  Result Value Ref Range Status   Specimen Description BLOOD LEFT ANTECUBITAL  Final   Special Requests   Final    BOTTLES DRAWN AEROBIC AND ANAEROBIC Blood Culture adequate volume   Culture  Setup Time   Final    GRAM POSITIVE RODS AEROBIC BOTTLE ONLY CRITICAL RESULT CALLED TO, READ BACK BY AND VERIFIED WITH:  RN C. OLDLAND 01/05/22 @ 2156 BY AB    Culture (A)  Final    BACILLUS SPECIES Standardized susceptibility testing for this organism is not available. Performed at [facility], [place] [address] [place], [place] [address]    Report Status 01/06/2022 FINAL  Final  Blood culture (routine x 2)     Status: None (Preliminary result)   Collection Time: 01/05/22  6:46 AM   Specimen: BLOOD LEFT HAND  Result Value Ref Range Status   Specimen Description BLOOD LEFT HAND  Final   Special Requests   Final    BOTTLES DRAWN AEROBIC ONLY Blood Culture results may not be optimal due to an inadequate volume of blood received in culture bottles   Culture    Final    NO GROWTH 3 DAYS Performed at [facility], [place] [address] [place], [place] [address]    Report Status PENDING  Incomplete  Culture, blood (routine x 2)     Status: None (Preliminary result)   Collection Time: 01/07/22  5:19 PM   Specimen: BLOOD  Result Value Ref Range Status   Specimen Description BLOOD LEFT ANTECUBITAL  Final   Special Requests   Final    BOTTLES DRAWN AEROBIC AND ANAEROBIC Blood Culture adequate volume   Culture   Final    NO GROWTH < 12 HOURS Performed at [facility], [place] [address] [place], [place] [address]    Report Status PENDING  Incomplete         Radiology  Studies: DG Forearm Right  Result Date: 01/07/2022 CLINICAL DATA:  RIGHT arm pain. EXAM: RIGHT FOREARM - 2 VIEW COMPARISON:  01/04/2022 FINDINGS: No fracture of the radius or ulna. The elbow joint and the wrist joint appear normal on two views. Soft tissue swelling over the distal forearm unchanged from comparison exam. No subcutaneous gas. No foreign body. IMPRESSION: 1. No change in soft tissue swelling of the distal forearm. 2. No osseous abnormality. 3. No foreign body. Electronically Signed   By: Suzy Bouchard M.D.   On: 01/07/2022 17:04        Scheduled Meds:  amLODipine  10 mg Oral Daily   atorvastatin  20 mg Oral Daily   enoxaparin (LOVENOX) injection  40 mg Subcutaneous Q24H   insulin aspart  0-9 Units Subcutaneous TID WC   losartan  50 mg Oral Daily   Continuous Infusions:  ceFEPime (MAXIPIME) IV 2 g (01/08/22 0155)   vancomycin 1,250 mg (01/08/22 0619)     LOS: 1 day   Time spent= 35 mins    Allycia Pitz Arsenio Loader, MD [facility]  If 7PM-7AM, please contact night-coverage  01/08/2022, 8:27 AM

## 2022-01-09 DIAGNOSIS — L03113 Cellulitis of right upper limb: Secondary | ICD-10-CM | POA: Diagnosis not present

## 2022-01-09 LAB — CBC
HCT: 41.3 % (ref 39.0–52.0)
Hemoglobin: 13.8 g/dL (ref 13.0–17.0)
MCH: 26.2 pg (ref 26.0–34.0)
MCHC: 33.4 g/dL (ref 30.0–36.0)
MCV: 78.4 fL — ABNORMAL LOW (ref 80.0–100.0)
Platelets: 248 10*3/uL (ref 150–400)
RBC: 5.27 MIL/uL (ref 4.22–5.81)
RDW: 13.2 % (ref 11.5–15.5)
WBC: 14.5 10*3/uL — ABNORMAL HIGH (ref 4.0–10.5)
nRBC: 0 % (ref 0.0–0.2)

## 2022-01-09 LAB — BASIC METABOLIC PANEL
Anion gap: 12 (ref 5–15)
BUN: 14 mg/dL (ref 8–23)
CO2: 20 mmol/L — ABNORMAL LOW (ref 22–32)
Calcium: 8.8 mg/dL — ABNORMAL LOW (ref 8.9–10.3)
Chloride: 100 mmol/L (ref 98–111)
Creatinine, Ser: 0.78 mg/dL (ref 0.61–1.24)
GFR, Estimated: >60 mL/min (ref >60)
Glucose, Bld: 142 mg/dL — ABNORMAL HIGH (ref 70–99)
Potassium: 3.8 mmol/L (ref 3.5–5.1)
Sodium: 132 mmol/L — ABNORMAL LOW (ref 135–145)

## 2022-01-09 LAB — GLUCOSE, CAPILLARY
Glucose-Capillary: 143 mg/dL — ABNORMAL HIGH (ref 70–99)
Glucose-Capillary: 134 mg/dL — ABNORMAL HIGH (ref 70–99)
Glucose-Capillary: 128 mg/dL — ABNORMAL HIGH (ref 70–99)
Glucose-Capillary: 156 mg/dL — ABNORMAL HIGH (ref 70–99)

## 2022-01-09 LAB — MAGNESIUM: Magnesium: 1.9 mg/dL (ref 1.7–2.4)

## 2022-01-09 NOTE — Progress Notes (Signed)
PROGRESS NOTE    Adrian Castillo  QMG:[id number] DOB: [birth year] DOA: 01/07/2022 PCP: Camillia Herter, NP   Brief Narrative:   66 y.o. male with medical history significant of hypertension, chronic hepatitis C, type 2 diabetes, history of IV heroin drug use who presents with worsening right forearm cellulitis. Reports last heroin use last week and injected it to his right forearm and his symptoms started 5 days prior to admission.  He came to the ED on 11/15 and eventually discharged on Bactrim but due to worsening of the symptoms he came back to the ER.  Right upper extremity x-ray shows no further changes and soft tissue swelling.  Patient was started on IV vancomycin and cefepime.   Assessment & Plan:  Principal Problem:   Cellulitis Active Problems:   IVDU (intravenous drug user)   HTN (hypertension)   Type 2 diabetes mellitus (HCC)     Assessment and Plan: * Cellulitis of right upper extremity without purulent discharge/abscess -Secondary to IV drug abuse.  Symptoms continue to worsen despite of taking oral Bactrim at home.  No evidence of abscess/purulent discharge or foreign bodies on the x-ray.  We will continue IV vancomycin and cefepime, if worsens we will have to obtain further imaging. For now monitor Cultures, high risk patient due to his IVDA hx.   Type 2 diabetes mellitus (HCC) -No recent hemoglobin A1c presented with CBG of 158 On sliding scale and Accu-Cheks  HTN (hypertension) IV as needed ordered Cont Norvasc, Losartan  Hyperlipidemia -Statin.   IVDU (intravenous drug user) Reports using heroin, counseled to quit using this    DVT prophylaxis: Lovenox Code Status: Full Code Family Communication: Fianc at bedside  Status is: Inpatient Remains inpatient appropriate because: [facility] stay for IV Abx, need to monitor Bcx as well. He is a high risk patient due to his IVDA hx.     Subjective: Feeling better no complaints today.  Remains afebrile  overnight. Examination: Constitutional: Not in acute distress Respiratory: Clear to auscultation bilaterally Cardiovascular: Normal sinus rhythm, no rubs Abdomen: Nontender nondistended good bowel sounds Musculoskeletal: No edema noted Skin: No rashes seen Neurologic: CN 2-12 grossly intact.  And nonfocal Psychiatric: Normal judgment and insight. Alert and oriented x 3. Normal mood. RUE dressing in place.   Objective: Vitals:   01/08/22 1745 01/08/22 1920 01/08/22 2347 01/09/22 0324  BP: (!) 151/97 (!) 153/83 (!) 153/86 (!) 141/84  Pulse:  95    Resp:  '20 20 20  '$ Temp:  99.4 F (37.4 C) 99.8 F (37.7 C) 98.3 F (36.8 C)  TempSrc:  Oral Oral Oral  SpO2:  96%    Weight:      Height:        Intake/Output Summary (Last 24 hours) at 01/09/2022 0402 Last data filed at 01/08/2022 1800 Gross per 24 hour  Intake 482.04 ml  Output 725 ml  Net -242.96 ml   Filed Weights   01/07/22 1400 01/07/22 2139  Weight: 88.5 kg 79.1 kg     Data Reviewed:   CBC: Recent Labs  Lab 01/05/22 0543 01/07/22 1719 01/08/22 1221 01/09/22 0315  WBC 13.2* 16.7* 15.4* 14.5*  NEUTROABS 10.5* 14.2*  --   --   HGB 13.7 13.7 13.4 13.8  HCT 42.2 41.6 40.9 41.3  MCV 79.2* 77.9* 77.0* 78.4*  PLT 215 258 266 326   Basic Metabolic Panel: Recent Labs  Lab 01/05/22 0543 01/07/22 1719 01/08/22 1221  NA 133* 134* 135  K 4.2 3.9 4.6  CL 94* 98 102  CO2 28 25 20*  GLUCOSE 208* 158* 130*  BUN '8 10 13  '$ CREATININE 0.89 0.98 0.79  CALCIUM 9.2 9.4 8.9   GFR: Estimated Creatinine Clearance: 96.7 mL/min (by C-G formula based on SCr of 0.79 mg/dL). Liver Function Tests: Recent Labs  Lab 01/05/22 0543 01/07/22 1719  AST 19 22  ALT 15 26  ALKPHOS 60 79  BILITOT 0.7 0.6  PROT 7.9 8.0  ALBUMIN 3.4* 3.2*   No results for input(s): "LIPASE", "AMYLASE" in the last 168 hours. No results for input(s): "AMMONIA" in the last 168 hours. Coagulation Profile: No results for input(s): "INR", "PROTIME"  in the last 168 hours. Cardiac Enzymes: No results for input(s): "CKTOTAL", "CKMB", "CKMBINDEX", "TROPONINI" in the last 168 hours. BNP (last 3 results) No results for input(s): "PROBNP" in the last 8760 hours. HbA1C: No results for input(s): "HGBA1C" in the last 72 hours. CBG: Recent Labs  Lab 01/07/22 2144 01/08/22 1137 01/08/22 1554 01/08/22 2023  GLUCAP 192* 139* 138* 157*   Lipid Profile: No results for input(s): "CHOL", "HDL", "LDLCALC", "TRIG", "CHOLHDL", "LDLDIRECT" in the last 72 hours. Thyroid Function Tests: No results for input(s): "TSH", "T4TOTAL", "FREET4", "T3FREE", "THYROIDAB" in the last 72 hours. Anemia Panel: No results for input(s): "VITAMINB12", "FOLATE", "FERRITIN", "TIBC", "IRON", "RETICCTPCT" in the last 72 hours. Sepsis Labs: Recent Labs  Lab 01/05/22 0544 01/07/22 1719 01/07/22 1840  LATICACIDVEN 1.2 1.3 1.4    Recent Results (from the past 240 hour(s))  Blood culture (routine x 2)     Status: Abnormal   Collection Time: 01/05/22  5:43 AM   Specimen: BLOOD  Result Value Ref Range Status   Specimen Description BLOOD LEFT ANTECUBITAL  Final   Special Requests   Final    BOTTLES DRAWN AEROBIC AND ANAEROBIC Blood Culture adequate volume   Culture  Setup Time   Final    GRAM POSITIVE RODS AEROBIC BOTTLE ONLY CRITICAL RESULT CALLED TO, READ BACK BY AND VERIFIED WITH:  RN C. OLDLAND 01/05/22 @ 2156 BY AB    Culture (A)  Final    BACILLUS SPECIES Standardized susceptibility testing for this organism is not available. Performed at [facility], [place] [address], [place], [place] [address]    Report Status 01/06/2022 FINAL  Final  Blood culture (routine x 2)     Status: None (Preliminary result)   Collection Time: 01/05/22  6:46 AM   Specimen: BLOOD LEFT HAND  Result Value Ref Range Status   Specimen Description BLOOD LEFT HAND  Final   Special Requests   Final    BOTTLES DRAWN AEROBIC ONLY Blood Culture results may not be optimal due to an  inadequate volume of blood received in culture bottles   Culture   Final    NO GROWTH 3 DAYS Performed at [facility], [place] [address], [place], [place] [address]    Report Status PENDING  Incomplete  Culture, blood (routine x 2)     Status: None (Preliminary result)   Collection Time: 01/07/22  5:19 PM   Specimen: BLOOD  Result Value Ref Range Status   Specimen Description BLOOD LEFT ANTECUBITAL  Final   Special Requests   Final    BOTTLES DRAWN AEROBIC AND ANAEROBIC Blood Culture adequate volume   Culture   Final    NO GROWTH < 12 HOURS Performed at [facility], [place] [address] [place], [place] [address]    Report Status PENDING  Incomplete  Radiology Studies: DG Forearm Right  Result Date: 01/07/2022 CLINICAL DATA:  RIGHT arm pain. EXAM: RIGHT FOREARM - 2 VIEW COMPARISON:  01/04/2022 FINDINGS: No fracture of the radius or ulna. The elbow joint and the wrist joint appear normal on two views. Soft tissue swelling over the distal forearm unchanged from comparison exam. No subcutaneous gas. No foreign body. IMPRESSION: 1. No change in soft tissue swelling of the distal forearm. 2. No osseous abnormality. 3. No foreign body. Electronically Signed   By: Suzy Bouchard M.D.   On: 01/07/2022 17:04        Scheduled Meds:  amLODipine  10 mg Oral Daily   atorvastatin  20 mg Oral Daily   enoxaparin (LOVENOX) injection  40 mg Subcutaneous Q24H   insulin aspart  0-9 Units Subcutaneous TID WC   losartan  50 mg Oral Daily   Continuous Infusions:  ceFEPime (MAXIPIME) IV 2 g (01/09/22 0116)   vancomycin 166.7 mL/hr at 01/08/22 1800     LOS: 2 days   Time spent= 35 mins    Pookela Sellin Arsenio Loader, MD [facility]  If 7PM-7AM, please contact night-coverage  01/09/2022, 4:02 AM

## 2022-01-10 ENCOUNTER — Other Ambulatory Visit (HOSPITAL_COMMUNITY): Payer: Self-pay

## 2022-01-10 DIAGNOSIS — L03113 Cellulitis of right upper limb: Secondary | ICD-10-CM | POA: Diagnosis not present

## 2022-01-10 LAB — GLUCOSE, CAPILLARY: Glucose-Capillary: 143 mg/dL — ABNORMAL HIGH (ref 70–99)

## 2022-01-10 LAB — BASIC METABOLIC PANEL
Anion gap: 13 (ref 5–15)
BUN: 14 mg/dL (ref 8–23)
CO2: 19 mmol/L — ABNORMAL LOW (ref 22–32)
Calcium: 9.4 mg/dL (ref 8.9–10.3)
Chloride: 104 mmol/L (ref 98–111)
Creatinine, Ser: 0.76 mg/dL (ref 0.61–1.24)
GFR, Estimated: >60 mL/min (ref >60)
Glucose, Bld: 130 mg/dL — ABNORMAL HIGH (ref 70–99)
Potassium: 4.8 mmol/L (ref 3.5–5.1)
Sodium: 136 mmol/L (ref 135–145)

## 2022-01-10 LAB — CBC
HCT: 43.1 % (ref 39.0–52.0)
Hemoglobin: 14.5 g/dL (ref 13.0–17.0)
MCH: 25.6 pg — ABNORMAL LOW (ref 26.0–34.0)
MCHC: 33.6 g/dL (ref 30.0–36.0)
MCV: 76.1 fL — ABNORMAL LOW (ref 80.0–100.0)
Platelets: 231 10*3/uL (ref 150–400)
RBC: 5.66 MIL/uL (ref 4.22–5.81)
RDW: 13.2 % (ref 11.5–15.5)
WBC: 11.2 10*3/uL — ABNORMAL HIGH (ref 4.0–10.5)
nRBC: 0 % (ref 0.0–0.2)

## 2022-01-10 LAB — HEMOGLOBIN A1C
Hgb A1c MFr Bld: 8.5 % — ABNORMAL HIGH (ref 4.8–5.6)
Mean Plasma Glucose: 197 mg/dL

## 2022-01-10 LAB — CULTURE, BLOOD (ROUTINE X 2): Culture: NO GROWTH

## 2022-01-10 LAB — MAGNESIUM: Magnesium: 2 mg/dL (ref 1.7–2.4)

## 2022-01-10 MED ORDER — SULFAMETHOXAZOLE-TRIMETHOPRIM 800-160 MG PO TABS
1.0000 | ORAL_TABLET | Freq: Two times a day (BID) | ORAL | 0 refills | Status: AC
  Filled 2022-01-10 – 2022-01-11 (×2): qty 14, 7d supply, fill #0

## 2022-01-10 MED ORDER — LOSARTAN POTASSIUM 50 MG PO TABS
50.0000 mg | ORAL_TABLET | Freq: Every day | ORAL | 0 refills | Status: AC
  Filled 2022-01-10 – 2022-01-11 (×2): qty 30, 30d supply, fill #0

## 2022-01-10 NOTE — Discharge Summary (Signed)
Physician Discharge Summary  Adrian Castillo MAY:[id number] DOB: 1955-09-07 DOA: 01/07/2022  PCP: Camillia Herter, NP  Admit date: 01/07/2022 Discharge date: 01/10/2022  Admitted From: Home Disposition:  Home  Recommendations for Outpatient Follow-up:  Follow up with PCP in 1-2 weeks Please obtain BMP/CBC in one week your next doctors visit.  Bactrim BID for 7 days. He doesn't know if he has enough bactrim at home from previously prescribed provider Increased Losartan Avoid using NSAIDs.    Discharge Condition: Stable CODE STATUS: Full code Diet recommendation: Diabetic  Brief/Interim Summary: Brief Narrative:   66 y.o. male with medical history significant of hypertension, chronic hepatitis C, type 2 diabetes, history of IV heroin drug use who presents with worsening right forearm cellulitis. Reports last heroin use last week and injected it to his right forearm and his symptoms started 5 days prior to admission.  He came to the ED on 11/15 and eventually discharged on Bactrim but due to worsening of the symptoms he came back to the ER.  Right upper extremity x-ray shows no further changes and soft tissue swelling.  Patient was started on IV vancomycin and cefepime.  Patient's cultures have remained negative, right upper extremity cellulitis is significantly improved.  Today he is medically stable for discharge with recommendations as stated above.     Assessment & Plan:  Principal Problem:   Cellulitis Active Problems:   IVDU (intravenous drug user)   HTN (hypertension)   Type 2 diabetes mellitus (HCC)       Assessment and Plan: * Cellulitis of right upper extremity without purulent discharge/abscess -Secondary to IV drug abuse.  Symptoms continue to worsen despite of taking oral Bactrim at home.  No evidence of abscess/purulent discharge or foreign bodies on the x-ray.  Initially patient received about 3 days of IV vancomycin and cefepime.  His blood cultures now remain negative  therefore will transition him to oral Bactrim upon discharge.  7 days has been prescribed and he will need to follow-up outpatient with PCP   Type 2 diabetes mellitus ([place]) On Farxiga at home, follow-up outpatient   HTN (hypertension) Continue home Norvasc.  Losartan increased to 50 mg daily   Hyperlipidemia -Statin.    IVDU (intravenous drug user) Reports using heroin, counseled to quit using this      Discharge Diagnoses:  Principal Problem:   Cellulitis Active Problems:   IVDU (intravenous drug user)   HTN (hypertension)   Type 2 diabetes mellitus ([place])      Consultations: None  Subjective: Feels well wants to go home  Discharge Exam: Vitals:   01/10/22 0004 01/10/22 0737  BP: (!) 149/80 136/89  Pulse:  74  Resp:  16  Temp: 99 F (37.2 C) 98.3 F (36.8 C)  SpO2:  95%   Vitals:   01/09/22 1442 01/09/22 1955 01/10/22 0004 01/10/22 0737  BP: (!) 152/92 (!) 129/96 (!) 149/80 136/89  Pulse: 85 78  74  Resp: 16   16  Temp: 100.1 F (37.8 C) 99.2 F (37.3 C) 99 F (37.2 C) 98.3 F (36.8 C)  TempSrc: Axillary Oral Oral Axillary  SpO2: 95% 96%  95%  Weight:      Height:        General: Pt is alert, awake, not in acute distress Cardiovascular: RRR, S1/S2 +, no rubs, no gallops Respiratory: CTA bilaterally, no wheezing, no rhonchi Abdominal: Soft, NT, ND, bowel sounds + Extremities: no edema, no cyanosis.  Right upper EXTR primary dressing in place.  Swelling appears to be significantly improved.  Discharge Instructions   Allergies as of 01/10/2022       Reactions   Crab [shellfish Allergy] Hives   Soft crab (receives TOO MUCH iodine) eats hard-shell crap with no issues   Tylenol [acetaminophen] Hives        Medication List     STOP taking these medications    ibuprofen 800 MG tablet Commonly known as: ADVIL   potassium chloride SA 20 MEQ tablet Commonly known as: KLOR-CON M       TAKE these medications    albuterol 108 (90  Base) MCG/ACT inhaler Commonly known as: VENTOLIN HFA Inhale 2 puffs into the lungs every 4 (four) hours as needed for wheezing or shortness of breath.   amLODipine 10 MG tablet Commonly known as: NORVASC Take 1 tablet (10 mg total) by mouth daily.   atorvastatin 20 MG tablet Commonly known as: LIPITOR Take 20 mg by mouth daily.   Farxiga 5 MG Tabs tablet Generic drug: dapagliflozin propanediol Take 5 mg by mouth daily.   losartan 50 MG tablet Commonly known as: COZAAR Take 1 tablet (50 mg total) by mouth daily. Start taking on: January 11, 2022 What changed:  medication strength how much to take   naloxone 4 MG/0.1ML Liqd nasal spray kit Commonly known as: NARCAN Use one spray in the nostril in the case of an overdose   sulfamethoxazole-trimethoprim 800-160 MG tablet Commonly known as: BACTRIM DS Take 1 tablet by mouth 2 (two) times daily for 7 days.   Symbicort 160-4.5 MCG/ACT inhaler Generic drug: budesonide-formoterol        Follow-up Information     Camillia Herter, NP. Call in 1 week(s).   Specialty: Nurse Practitioner Contact information: [place] [address] [phone]                Allergies  Allergen Reactions   Crab [Shellfish Allergy] Hives    Soft crab (receives TOO MUCH iodine) eats hard-shell crap with no issues   Tylenol [Acetaminophen] Hives    You were cared for by a hospitalist during your hospital stay. If you have any questions about your discharge medications or the care you received while you were in the hospital after you are discharged, you can call the unit and asked to speak with the hospitalist on call if the hospitalist that took care of you is not available. Once you are discharged, your primary care physician will handle any further medical issues. Please note that no refills for any discharge medications will be authorized once you are discharged, as it is imperative that you return to your  primary care physician (or establish a relationship with a primary care physician if you do not have one) for your aftercare needs so that they can reassess your need for medications and monitor your lab values.   Procedures/Studies: DG Forearm Right  Result Date: 01/07/2022 CLINICAL DATA:  RIGHT arm pain. EXAM: RIGHT FOREARM - 2 VIEW COMPARISON:  01/04/2022 FINDINGS: No fracture of the radius or ulna. The elbow joint and the wrist joint appear normal on two views. Soft tissue swelling over the distal forearm unchanged from comparison exam. No subcutaneous gas. No foreign body. IMPRESSION: 1. No change in soft tissue swelling of the distal forearm. 2. No osseous abnormality. 3. No foreign body. Electronically Signed   By: Suzy Bouchard M.D.   On: 01/07/2022 17:04   DG Forearm Right  Result Date: 01/04/2022 CLINICAL  DATA:  66 year old male, history of IV drug use and concern for abscess. EXAM: RIGHT FOREARM - 2 VIEW COMPARISON:  None Available. FINDINGS: Soft tissue swelling over the radial aspect of the forearm the soft tissues. Generalized subcutaneous edema is suggested, edema throughout the forearm. No radiopaque foreign body or acute bony abnormality. IMPRESSION: 1. Soft tissue swelling over the radial aspect of the forearm. Perhaps this represents the site of suspected abscess though there is diffuse edema throughout the forearm. Findings may reflect more extensive soft tissue infection and or cellulitis. No radiopaque foreign body or acute bony abnormality. Electronically Signed   By: Zetta Bills M.D.   On: 01/04/2022 13:24     The results of significant diagnostics from this hospitalization (including imaging, microbiology, ancillary and laboratory) are listed below for reference.     Microbiology: Recent Results (from the past 240 hour(s))  Blood culture (routine x 2)     Status: Abnormal   Collection Time: 01/05/22  5:43 AM   Specimen: BLOOD  Result Value Ref Range Status    Specimen Description BLOOD LEFT ANTECUBITAL  Final   Special Requests   Final    BOTTLES DRAWN AEROBIC AND ANAEROBIC Blood Culture adequate volume   Culture  Setup Time   Final    GRAM POSITIVE RODS AEROBIC BOTTLE ONLY CRITICAL RESULT CALLED TO, READ BACK BY AND VERIFIED WITH:  RN C. OLDLAND 01/05/22 @ 2156 BY AB    Culture (A)  Final    BACILLUS SPECIES Standardized susceptibility testing for this organism is not available. Performed at [facility], [place] [address], [place], [place] [address]    Report Status 01/06/2022 FINAL  Final  Blood culture (routine x 2)     Status: None   Collection Time: 01/05/22  6:46 AM   Specimen: BLOOD LEFT HAND  Result Value Ref Range Status   Specimen Description BLOOD LEFT HAND  Final   Special Requests   Final    BOTTLES DRAWN AEROBIC ONLY Blood Culture results may not be optimal due to an inadequate volume of blood received in culture bottles   Culture   Final    NO GROWTH 5 DAYS Performed at [facility], [place] [address], [place], [place] [address]    Report Status 01/10/2022 FINAL  Final  Culture, blood (routine x 2)     Status: None (Preliminary result)   Collection Time: 01/07/22  2:10 PM   Specimen: BLOOD LEFT WRIST  Result Value Ref Range Status   Specimen Description BLOOD LEFT WRIST  Final   Special Requests   Final    BOTTLES DRAWN AEROBIC ONLY Blood Culture results may not be optimal due to an inadequate volume of blood received in culture bottles   Culture   Final    NO GROWTH 2 DAYS Performed at [facility], [place] [address] [place], [place] [address]    Report Status PENDING  Incomplete  Culture, blood (routine x 2)     Status: None (Preliminary result)   Collection Time: 01/07/22  5:19 PM   Specimen: BLOOD  Result Value Ref Range Status   Specimen Description BLOOD LEFT ANTECUBITAL  Final   Special Requests   Final    BOTTLES DRAWN AEROBIC AND ANAEROBIC Blood Culture adequate volume   Culture   Final     NO GROWTH 3 DAYS Performed at [facility], [place] [address], [place], [place] [address]    Report Status PENDING  Incomplete     Labs: BNP (last 3 results) No results for input(s): "BNP" in the last 8760 hours. Basic Metabolic Panel: Recent Labs  Lab 01/05/22 0543 01/07/22 1719 01/08/22 1221 01/09/22 0315  NA 133* 134* 135 132*  K 4.2 3.9 4.6 3.8  CL 94* 98 102 100  CO2 28 25 20* 20*  GLUCOSE 208* 158* 130* 142*  BUN _0 CREATININE 0.89 0.98 0.79 0.78  CALCIUM 9.2 9.4 8.9 8.8*  MG  --   --   --  1.9   Liver Function Tests: Recent Labs  Lab 01/05/22 0543 01/07/22 1719  AST 19 22  ALT 15 26  ALKPHOS 60 79  BILITOT 0.7 0.6  PROT 7.9 8.0  ALBUMIN 3.4* 3.2*   No results for input(s): "LIPASE", "AMYLASE" in the last 168 hours. No results for input(s): "AMMONIA" in the last 168 hours. CBC: Recent Labs  Lab 01/05/22 0543 01/07/22 1719 01/08/22 1221 01/09/22 0315 01/10/22 0539  WBC 13.2* 16.7* 15.4* 14.5* 11.2*  NEUTROABS 10.5* 14.2*  --   --   --   HGB 13.7 13.7 13.4 13.8 14.5  HCT 42.2 41.6 40.9 41.3 43.1  MCV 79.2* 77.9* 77.0* 78.4* 76.1*  PLT 215 258 266 248 231   Cardiac Enzymes: No results for input(s): "CKTOTAL", "CKMB", "CKMBINDEX", "TROPONINI" in the last 168 hours. BNP: Invalid input(s): "POCBNP" CBG: Recent Labs  Lab 01/09/22 0809 01/09/22 1321 01/09/22 1551 01/09/22 2108 01/10/22 0732  GLUCAP 143* 134* 128* 156* 143*   D-Dimer No results for input(s): "DDIMER" in the last 72 hours. Hgb A1c No results for input(s): "HGBA1C" in the last 72 hours. Lipid Profile No results for input(s): "CHOL", "HDL", "LDLCALC", "TRIG", "CHOLHDL", "LDLDIRECT" in the last 72 hours. Thyroid function studies No results for input(s): "TSH", "T4TOTAL", "T3FREE", "THYROIDAB" in the last 72 hours.  Invalid input(s): "FREET3" Anemia work up No results for input(s): "VITAMINB12", "FOLATE", "FERRITIN", "TIBC", "IRON", "RETICCTPCT" in the last 72  hours. Urinalysis    Component Value Date/Time   COLORURINE YELLOW 10/03/2017 1950   APPEARANCEUR HAZY (A) 10/03/2017 1950   LABSPEC 1.034 (H) 10/03/2017 1950   PHURINE 6.0 10/03/2017 1950   GLUCOSEU NEGATIVE 10/03/2017 1950   HGBUR NEGATIVE 10/03/2017 1950   BILIRUBINUR NEGATIVE 10/03/2017 1950   KETONESUR NEGATIVE 10/03/2017 1950   PROTEINUR NEGATIVE 10/03/2017 1950   NITRITE NEGATIVE 10/03/2017 1950   LEUKOCYTESUR NEGATIVE 10/03/2017 1950   Sepsis Labs Recent Labs  Lab 01/07/22 1719 01/08/22 1221 01/09/22 0315 01/10/22 0539  WBC 16.7* 15.4* 14.5* 11.2*   Microbiology Recent Results (from the past 240 hour(s))  Blood culture (routine x 2)     Status: Abnormal   Collection Time: 01/05/22  5:43 AM   Specimen: BLOOD  Result Value Ref Range Status   Specimen Description BLOOD LEFT ANTECUBITAL  Final   Special Requests   Final    BOTTLES DRAWN AEROBIC AND ANAEROBIC Blood Culture adequate volume   Culture  Setup Time   Final    GRAM POSITIVE RODS AEROBIC BOTTLE ONLY CRITICAL RESULT CALLED TO, READ BACK BY AND VERIFIED WITH:  RN C. OLDLAND 01/05/22 @ 2156 BY AB    Culture (A)  Final    BACILLUS SPECIES Standardized susceptibility testing for this organism is not available. Performed at [facility], [place] [address] [place], [place] [address]    Report Status 01/06/2022 FINAL  Final  Blood culture (routine x 2)     Status: None  Collection Time: 01/05/22  6:46 AM   Specimen: BLOOD LEFT HAND  Result Value Ref Range Status   Specimen Description BLOOD LEFT HAND  Final   Special Requests   Final    BOTTLES DRAWN AEROBIC ONLY Blood Culture results may not be optimal due to an inadequate volume of blood received in culture bottles   Culture   Final    NO GROWTH 5 DAYS Performed at [facility], [place] [address] [place], [place] [address]    Report Status 01/10/2022 FINAL  Final  Culture, blood (routine x 2)     Status: None (Preliminary result)   Collection  Time: 01/07/22  2:10 PM   Specimen: BLOOD LEFT WRIST  Result Value Ref Range Status   Specimen Description BLOOD LEFT WRIST  Final   Special Requests   Final    BOTTLES DRAWN AEROBIC ONLY Blood Culture results may not be optimal due to an inadequate volume of blood received in culture bottles   Culture   Final    NO GROWTH 2 DAYS Performed at [facility], [place] [address], [place], [place] [address]    Report Status PENDING  Incomplete  Culture, blood (routine x 2)     Status: None (Preliminary result)   Collection Time: 01/07/22  5:19 PM   Specimen: BLOOD  Result Value Ref Range Status   Specimen Description BLOOD LEFT ANTECUBITAL  Final   Special Requests   Final    BOTTLES DRAWN AEROBIC AND ANAEROBIC Blood Culture adequate volume   Culture   Final    NO GROWTH 3 DAYS Performed at [facility], [place] [address] [place], [place] [address]    Report Status PENDING  Incomplete     Time coordinating discharge:  I have spent 35 minutes face to face with the patient and on the ward discussing the patients care, assessment, plan and disposition with other care givers. >50% of the time was devoted counseling the patient about the risks and benefits of treatment/Discharge disposition and coordinating care.   SIGNED:   Damita Lack, MD  [facility] 01/10/2022, 10:32 AM   If 7PM-7AM, please contact night-coverage

## 2022-01-10 NOTE — Care Management Important Message (Signed)
Important Message  Patient Details  Name: Adrian Castillo MRN: [id number] Date of Birth: April 09, 1955   Medicare Important Message Given:  Yes     Hannah Beat 01/10/2022, 3:00 PM

## 2022-01-10 NOTE — Plan of Care (Signed)
Problem: Education: Goal: Ability to describe self-care measures that may prevent or decrease complications (Diabetes Survival Skills Education) will improve 01/10/2022 1053 by Colon Flattery, RN Outcome: Adequate for Discharge 01/10/2022 1052 by Colon Flattery, RN Outcome: Progressing Goal: Individualized Educational Video(s) 01/10/2022 1053 by Colon Flattery, RN Outcome: Adequate for Discharge 01/10/2022 1052 by Colon Flattery, RN Outcome: Progressing   Problem: Coping: Goal: Ability to adjust to condition or change in health will improve 01/10/2022 1053 by Colon Flattery, RN Outcome: Adequate for Discharge 01/10/2022 1052 by Colon Flattery, RN Outcome: Progressing   Problem: Fluid Volume: Goal: Ability to maintain a balanced intake and output will improve 01/10/2022 1053 by Colon Flattery, RN Outcome: Adequate for Discharge 01/10/2022 1052 by Colon Flattery, RN Outcome: Progressing   Problem: Health Behavior/Discharge Planning: Goal: Ability to identify and utilize available resources and services will improve 01/10/2022 1053 by Colon Flattery, RN Outcome: Adequate for Discharge 01/10/2022 1052 by Colon Flattery, RN Outcome: Progressing Goal: Ability to manage health-related needs will improve 01/10/2022 1053 by Colon Flattery, RN Outcome: Adequate for Discharge 01/10/2022 1052 by Colon Flattery, RN Outcome: Progressing   Problem: Metabolic: Goal: Ability to maintain appropriate glucose levels will improve 01/10/2022 1053 by Colon Flattery, RN Outcome: Adequate for Discharge 01/10/2022 1052 by Colon Flattery, RN Outcome: Progressing   Problem: Nutritional: Goal: Maintenance of adequate nutrition will improve 01/10/2022 1053 by Colon Flattery, RN Outcome: Adequate for Discharge 01/10/2022 1052 by Colon Flattery, RN Outcome: Progressing Goal: Progress toward achieving an optimal weight will improve 01/10/2022 1053 by Colon Flattery, RN Outcome: Adequate for  Discharge 01/10/2022 1052 by Colon Flattery, RN Outcome: Progressing   Problem: Skin Integrity: Goal: Risk for impaired skin integrity will decrease 01/10/2022 1053 by Colon Flattery, RN Outcome: Adequate for Discharge 01/10/2022 1052 by Colon Flattery, RN Outcome: Progressing   Problem: Tissue Perfusion: Goal: Adequacy of tissue perfusion will improve 01/10/2022 1053 by Colon Flattery, RN Outcome: Adequate for Discharge 01/10/2022 1052 by Colon Flattery, RN Outcome: Progressing   Problem: Education: Goal: Knowledge of General Education information will improve Description: Including pain rating scale, medication(s)/side effects and non-pharmacologic comfort measures 01/10/2022 1053 by Colon Flattery, RN Outcome: Adequate for Discharge 01/10/2022 1052 by Colon Flattery, RN Outcome: Progressing   Problem: Health Behavior/Discharge Planning: Goal: Ability to manage health-related needs will improve 01/10/2022 1053 by Colon Flattery, RN Outcome: Adequate for Discharge 01/10/2022 1052 by Colon Flattery, RN Outcome: Progressing   Problem: Clinical Measurements: Goal: Ability to maintain clinical measurements within normal limits will improve 01/10/2022 1053 by Colon Flattery, RN Outcome: Adequate for Discharge 01/10/2022 1052 by Colon Flattery, RN Outcome: Progressing Goal: Will remain free from infection 01/10/2022 1053 by Colon Flattery, RN Outcome: Adequate for Discharge 01/10/2022 1052 by Colon Flattery, RN Outcome: Progressing Goal: Diagnostic test results will improve 01/10/2022 1053 by Colon Flattery, RN Outcome: Adequate for Discharge 01/10/2022 1052 by Colon Flattery, RN Outcome: Progressing Goal: Respiratory complications will improve 01/10/2022 1053 by Colon Flattery, RN Outcome: Adequate for Discharge 01/10/2022 1052 by Colon Flattery, RN Outcome: Progressing Goal: Cardiovascular complication will be avoided 01/10/2022 1053 by Colon Flattery, RN Outcome:  Adequate for Discharge 01/10/2022 1052 by Colon Flattery, RN Outcome: Progressing   Problem: Activity: Goal: Risk for activity intolerance will decrease 01/10/2022 1053 by Colon Flattery, RN Outcome: Adequate for Discharge 01/10/2022 1052 by Colon Flattery, RN Outcome: Progressing   Problem: Nutrition: Goal: Adequate nutrition will be maintained 01/10/2022 1053 by Colon Flattery, RN Outcome: Adequate for Discharge 01/10/2022 1052 by Colon Flattery, RN Outcome: Progressing  Problem: Coping: Goal: Level of anxiety will decrease 01/10/2022 1053 by Colon Flattery, RN Outcome: Adequate for Discharge 01/10/2022 1052 by Colon Flattery, RN Outcome: Progressing   Problem: Elimination: Goal: Will not experience complications related to bowel motility 01/10/2022 1053 by Colon Flattery, RN Outcome: Adequate for Discharge 01/10/2022 1052 by Colon Flattery, RN Outcome: Progressing Goal: Will not experience complications related to urinary retention 01/10/2022 1053 by Colon Flattery, RN Outcome: Adequate for Discharge 01/10/2022 1052 by Colon Flattery, RN Outcome: Progressing   Problem: Pain Managment: Goal: General experience of comfort will improve 01/10/2022 1053 by Colon Flattery, RN Outcome: Adequate for Discharge 01/10/2022 1052 by Colon Flattery, RN Outcome: Progressing   Problem: Safety: Goal: Ability to remain free from injury will improve 01/10/2022 1053 by Colon Flattery, RN Outcome: Adequate for Discharge 01/10/2022 1052 by Colon Flattery, RN Outcome: Progressing   Problem: Skin Integrity: Goal: Risk for impaired skin integrity will decrease 01/10/2022 1053 by Colon Flattery, RN Outcome: Adequate for Discharge 01/10/2022 1052 by Colon Flattery, RN Outcome: Progressing   Problem: Safety: Goal: Non-violent Restraint(s) 01/10/2022 1053 by Colon Flattery, RN Outcome: Adequate for Discharge 01/10/2022 1052 by Colon Flattery, RN Outcome: Progressing   Problem:  Safety: Goal: Non-violent Restraint(s) 01/10/2022 1053 by Colon Flattery, RN Outcome: Adequate for Discharge 01/10/2022 1052 by Colon Flattery, RN Outcome: Progressing

## 2022-01-10 NOTE — Progress Notes (Signed)
Pt verbalized understanding of d/c instructions. D/c home with wife.

## 2022-01-10 NOTE — Plan of Care (Signed)
  Problem: Education: Goal: Ability to describe self-care measures that may prevent or decrease complications (Diabetes Survival Skills Education) will improve Outcome: Progressing Goal: Individualized Educational Video(s) Outcome: Progressing   Problem: Coping: Goal: Ability to adjust to condition or change in health will improve Outcome: Progressing   Problem: Fluid Volume: Goal: Ability to maintain a balanced intake and output will improve Outcome: Progressing   Problem: Health Behavior/Discharge Planning: Goal: Ability to identify and utilize available resources and services will improve Outcome: Progressing Goal: Ability to manage health-related needs will improve Outcome: Progressing   Problem: Metabolic: Goal: Ability to maintain appropriate glucose levels will improve Outcome: Progressing   Problem: Nutritional: Goal: Maintenance of adequate nutrition will improve Outcome: Progressing Goal: Progress toward achieving an optimal weight will improve Outcome: Progressing   Problem: Skin Integrity: Goal: Risk for impaired skin integrity will decrease Outcome: Progressing   Problem: Tissue Perfusion: Goal: Adequacy of tissue perfusion will improve Outcome: Progressing   Problem: Education: Goal: Knowledge of General Education information will improve Description: Including pain rating scale, medication(s)/side effects and non-pharmacologic comfort measures Outcome: Progressing   Problem: Health Behavior/Discharge Planning: Goal: Ability to manage health-related needs will improve Outcome: Progressing   Problem: Clinical Measurements: Goal: Ability to maintain clinical measurements within normal limits will improve Outcome: Progressing Goal: Will remain free from infection Outcome: Progressing Goal: Diagnostic test results will improve Outcome: Progressing Goal: Respiratory complications will improve Outcome: Progressing Goal: Cardiovascular complication will  be avoided Outcome: Progressing   Problem: Activity: Goal: Risk for activity intolerance will decrease Outcome: Progressing   Problem: Nutrition: Goal: Adequate nutrition will be maintained Outcome: Progressing   Problem: Coping: Goal: Level of anxiety will decrease Outcome: Progressing   Problem: Elimination: Goal: Will not experience complications related to bowel motility Outcome: Progressing Goal: Will not experience complications related to urinary retention Outcome: Progressing   Problem: Pain Managment: Goal: General experience of comfort will improve Outcome: Progressing   Problem: Safety: Goal: Ability to remain free from injury will improve Outcome: Progressing   Problem: Skin Integrity: Goal: Risk for impaired skin integrity will decrease Outcome: Progressing   Problem: Safety: Goal: Non-violent Restraint(s) Outcome: Progressing   Problem: Safety: Goal: Non-violent Restraint(s) Outcome: Progressing

## 2022-01-11 ENCOUNTER — Other Ambulatory Visit: Payer: Self-pay

## 2022-01-11 ENCOUNTER — Other Ambulatory Visit (HOSPITAL_COMMUNITY): Payer: Self-pay

## 2022-01-12 LAB — CULTURE, BLOOD (ROUTINE X 2)
Culture: NO GROWTH
Special Requests: ADEQUATE

## 2022-01-13 LAB — CULTURE, BLOOD (ROUTINE X 2): Culture: NO GROWTH

## 2022-01-17 ENCOUNTER — Telehealth: Payer: Self-pay

## 2022-01-17 NOTE — Telephone Encounter (Signed)
Transition Care Management Follow-up Telephone Call Date of discharge and from where: 01/10/2022 [facility]  How have you been since you were released from the hospital? Durene Fruits, NP was listed as PCP and he said he has a new PCP at [facility] and will be seeing her tomorrow.  Epic has been updated. He stated that his arm still hurts and he was not given any pain medication and he will discuss his medication needs with PCP tomorrow.  Any questions or concerns? Yes- He is homeless. He said that living on the street is a better environment than living with someone.  He plans to speak to the caseworker at [facility] tomorrow about options for senior housing.   Items Reviewed: Did the pt receive and understand the discharge instructions provided? Yes  Medications obtained and verified?  He has the bactrim but no other medications.  He said he still needs to pick them up.  Other? No  Any new allergies since your discharge? No  Dietary orders reviewed? No Do you have support at home? no  Home Care and Equipment/Supplies: Were home health services ordered? no If so, what is the name of the agency? N/a  Has the agency set up a time to come to the patient's home? not applicable Were any new equipment or medical supplies ordered?  No What is the name of the medical supply agency? N/a Were you able to get the supplies/equipment? not applicable Do you have any questions related to the use of the equipment or supplies? No  Functional Questionnaire: (I = Independent and D = Dependent) ADLs: independent   Follow up appointments reviewed:  PCP Hospital f/u appt confirmed? Yes  Scheduled to see Cletis Athens, NP at [facility] - 01/18/2022. [facility] f/u appt confirmed?  None scheduled at this time    Are transportation arrangements needed? No  If their condition worsens, is the pt aware to call PCP or go to the Emergency Dept.? Yes Was the patient provided with  contact information for the PCP's office or ED? Yes Was to pt encouraged to call back with questions or concerns? He was instructed to call PCP at [facility]

## 2022-01-18 ENCOUNTER — Emergency Department (HOSPITAL_BASED_OUTPATIENT_CLINIC_OR_DEPARTMENT_OTHER): Payer: Medicare Other

## 2022-01-18 ENCOUNTER — Emergency Department (HOSPITAL_BASED_OUTPATIENT_CLINIC_OR_DEPARTMENT_OTHER)
Admission: EM | Admit: 2022-01-18 | Discharge: 2022-01-18 | Disposition: A | Discharge status: Home / Self Care | Payer: Medicare Other | Attending: Emergency Medicine | Admitting: Emergency Medicine

## 2022-01-18 ENCOUNTER — Other Ambulatory Visit: Payer: Self-pay

## 2022-01-18 ENCOUNTER — Encounter (HOSPITAL_BASED_OUTPATIENT_CLINIC_OR_DEPARTMENT_OTHER): Payer: Self-pay

## 2022-01-18 DIAGNOSIS — S0990XA Unspecified injury of head, initial encounter: Secondary | ICD-10-CM | POA: Insufficient documentation

## 2022-01-18 DIAGNOSIS — J3489 Other specified disorders of nose and nasal sinuses: Secondary | ICD-10-CM

## 2022-01-18 DIAGNOSIS — W01198A Fall on same level from slipping, tripping and stumbling with subsequent striking against other object, initial encounter: Secondary | ICD-10-CM | POA: Diagnosis not present

## 2022-01-18 DIAGNOSIS — S01112A Laceration without foreign body of left eyelid and periocular area, initial encounter: Secondary | ICD-10-CM | POA: Diagnosis not present

## 2022-01-18 DIAGNOSIS — S0592XA Unspecified injury of left eye and orbit, initial encounter: Secondary | ICD-10-CM | POA: Diagnosis present

## 2022-01-18 DIAGNOSIS — S0083XA Contusion of other part of head, initial encounter: Secondary | ICD-10-CM

## 2022-01-18 NOTE — ED Provider Notes (Signed)
[facility] EMERGENCY DEPARTMENT Provider Note   CSN: [id number] Arrival date & time: 01/18/22  1730     History {Add pertinent medical, surgical, social history, OB history to HPI:1} Chief Complaint  Patient presents with   Eye Injury   Fall    Adrian Castillo is a 66 y.o. male.  He is here for evaluation of an injury to his left eye.  He said he tripped over some toys and hit the dresser this morning.  Complaining of some pain and swelling around his left eye.  He denies any eye pain itself and no blurry vision.  He said his tetanus is up-to-date.  Not on any blood thinners.  The history is provided by the patient.  Eye Injury This is a new problem. The current episode started 6 to 12 hours ago. The problem occurs constantly. The problem has not changed since onset.Nothing aggravates the symptoms. Nothing relieves the symptoms. He has tried nothing for the symptoms. The treatment provided no relief.       Home Medications Prior to Admission medications   Medication Sig Start Date End Date Taking? Authorizing Provider  albuterol (VENTOLIN HFA) 108 (90 Base) MCG/ACT inhaler Inhale 2 puffs into the lungs every 4 (four) hours as needed for wheezing or shortness of breath. Patient not taking: Reported on 01/07/2022 12/15/20   Camillia Herter, NP  amLODipine (NORVASC) 10 MG tablet Take 1 tablet (10 mg total) by mouth daily. 12/15/20 01/07/22  Camillia Herter, NP  atorvastatin (LIPITOR) 20 MG tablet Take 20 mg by mouth daily. 08/04/21   [provider]  FARXIGA 5 MG TABS tablet Take 5 mg by mouth daily. 08/05/21   [provider]  losartan (COZAAR) 50 MG tablet Take 1 tablet (50 mg total) by mouth daily. 01/11/22   Damita Lack, MD  naloxone [facility]) nasal spray 4 mg/0.1 mL Use one spray in the nostril in the case of an overdose Patient not taking: Reported on 10/01/2020 03/31/20   Couture, Cortni S, PA-C  sulfamethoxazole-trimethoprim (BACTRIM DS) 800-160 MG  tablet Take 1 tablet by mouth 2 (two) times daily for 7 days. 01/10/22 01/18/22  Damita Lack, MD  SYMBICORT 160-4.5 MCG/ACT inhaler  08/12/21   [provider]      Allergies    Otho Darner allergy] and Tylenol [acetaminophen]    Review of Systems   Review of Systems  Constitutional:  Negative for fever.  HENT:  Positive for facial swelling.   Eyes:  Negative for pain and visual disturbance.    Physical Exam Updated Vital Signs BP 121/82 (BP Location: Left Arm)   Pulse 66   Temp 98.3 F (36.8 C)   Resp 18   Ht '5\' 11"'$  (1.803 m)   Wt 84.8 kg   SpO2 99%   BMI 26.08 kg/m  Physical Exam Vitals and nursing note reviewed.  Constitutional:      General: He is not in acute distress.    Appearance: Normal appearance. He is well-developed.  HENT:     Head: Normocephalic.     Comments: He has tenderness over his left superior orbital rim.  There is a little bit of ecchymosis.  There is a small laceration.  The eye itself pupil equal round and reactive anterior chamber clear extraocular movements intact.  Patient denies any blurry vision. Eyes:     Conjunctiva/sclera: Conjunctivae normal.  Cardiovascular:     Rate and Rhythm: Normal rate and regular rhythm.  Heart sounds: No murmur heard. Pulmonary:     Effort: Pulmonary effort is normal. No respiratory distress.     Breath sounds: Normal breath sounds.  Abdominal:     Palpations: Abdomen is soft.     Tenderness: There is no abdominal tenderness.  Musculoskeletal:        General: Tenderness present.     Cervical back: Neck supple.     Comments: Patient has a dressing over his right forearm where he said he had a cellulitis and an abscess drained.  Skin:    General: Skin is warm and dry.     Capillary Refill: Capillary refill takes less than 2 seconds.  Neurological:     General: No focal deficit present.     Mental Status: He is alert.     ED Results / Procedures / Treatments   Labs (all labs  ordered are listed, but only abnormal results are displayed) Labs Reviewed - No data to display  EKG None  Radiology CT Head Wo Contrast  Result Date: 01/18/2022 CLINICAL DATA:  Fall.  Facial trauma.  Laceration left eyebrow EXAM: CT HEAD WITHOUT CONTRAST CT MAXILLOFACIAL WITHOUT CONTRAST TECHNIQUE: Multidetector CT imaging of the head and maxillofacial structures were performed using the standard protocol without intravenous contrast. Multiplanar CT image reconstructions of the maxillofacial structures were also generated. RADIATION DOSE REDUCTION: This exam was performed according to the departmental dose-optimization program which includes automated exposure control, adjustment of the mA and/or kV according to patient size and/or use of iterative reconstruction technique. COMPARISON:  CT head 10/03/2017 FINDINGS: CT HEAD FINDINGS Brain: No evidence of acute infarction, hemorrhage, hydrocephalus, extra-axial collection or mass lesion/mass effect. Patchy white matter hypodensity bilaterally consistent with chronic microvascular ischemia. Vascular: Negative for hyperdense vessel Skull: Negative Other: None CT MAXILLOFACIAL FINDINGS Osseous: Negative for facial fracture Orbits: Orbital contents normal bilaterally.Negative for orbital fracture Sinuses: Mucosal edema right maxillary sinus. 11 x 22 mm lesion in the left nasal cavity projecting into the left maxillary sinus. This has enlarged since the prior study. No bony destruction or aggressive features. Soft tissues: Mild soft tissue swelling left eyelid. IMPRESSION: 1. No acute intracranial abnormality. 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 Negative for facial fracture. Mild soft tissue swelling left eyelid. 2. 11 x 22 mm lesion in the left nasal cavity projecting into the left maxillary sinus. This has enlarged since the prior study. This could be a polyp or mucocele. Recommend ENT evaluation. Electronically Signed   By: Franchot Gallo M.D.    On: 01/18/2022 18:46   CT MAXILLOFACIAL WO CONTRAST  Result Date: 01/18/2022 CLINICAL DATA:  Fall.  Facial trauma.  Laceration left eyebrow EXAM: CT HEAD WITHOUT CONTRAST CT MAXILLOFACIAL WITHOUT CONTRAST TECHNIQUE: Multidetector CT imaging of the head and maxillofacial structures were performed using the standard protocol without intravenous contrast. Multiplanar CT image reconstructions of the maxillofacial structures were also generated. RADIATION DOSE REDUCTION: This exam was performed according to the departmental dose-optimization program which includes automated exposure control, adjustment of the mA and/or kV according to patient size and/or use of iterative reconstruction technique. COMPARISON:  CT head 10/03/2017 FINDINGS: CT HEAD FINDINGS Brain: No evidence of acute infarction, hemorrhage, hydrocephalus, extra-axial collection or mass lesion/mass effect. Patchy white matter hypodensity bilaterally consistent with chronic microvascular ischemia. Vascular: Negative for hyperdense vessel Skull: Negative Other: None CT MAXILLOFACIAL FINDINGS Osseous: Negative for facial fracture Orbits: Orbital contents normal bilaterally.Negative for orbital fracture Sinuses: Mucosal edema right maxillary sinus. 11 x 22 mm lesion  in the left nasal cavity projecting into the left maxillary sinus. This has enlarged since the prior study. No bony destruction or aggressive features. Soft tissues: Mild soft tissue swelling left eyelid. IMPRESSION: 1. No acute intracranial abnormality. 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 Negative for facial fracture. Mild soft tissue swelling left eyelid. 2. 11 x 22 mm lesion in the left nasal cavity projecting into the left maxillary sinus. This has enlarged since the prior study. This could be a polyp or mucocele. Recommend ENT evaluation. Electronically Signed   By: Franchot Gallo M.D.   On: 01/18/2022 18:46    Procedures Procedures  {Document cardiac monitor,  telemetry assessment procedure when appropriate:1}  Medications Ordered in ED Medications - No data to display  ED Course/ Medical Decision Making/ A&P                           Medical Decision Making Amount and/or Complexity of Data Reviewed Radiology: ordered.   This patient complains of ***; this involves an extensive number of treatment Options and is a complaint that carries with it a high risk of complications and morbidity. The differential includes ***  I ordered, reviewed and interpreted labs, which included *** I ordered medication *** and reviewed PMP when indicated. I ordered imaging studies which included *** and I independently    visualized and interpreted imaging which showed *** Additional history obtained from *** Previous records obtained and reviewed *** I consulted *** and discussed lab and imaging findings and discussed disposition.  Cardiac monitoring reviewed, *** Social determinants considered, *** Critical Interventions: ***  After the interventions stated above, I reevaluated the patient and found *** Admission and further testing considered, ***   {Document critical care time when appropriate:1} {Document review of labs and clinical decision tools ie heart score, Chads2Vasc2 etc:1}  {Document your independent review of radiology images, and any outside records:1} {Document your discussion with family members, caretakers, and with consultants:1} {Document social determinants of health affecting pt's care:1} {Document your decision making why or why not admission, treatments were needed:1} Final Clinical Impression(s) / ED Diagnoses Final diagnoses:  None    Rx / DC Orders ED Discharge Orders     None

## 2022-01-18 NOTE — Discharge Instructions (Signed)
You were seen in the emergency department for evaluation of injury to left orbit.  You had a small laceration that was not sutured.  Your CAT scan did not show any evidence of fracture and there is no evidence of any eye injury.  Your CAT scan did showed a nasal polyp and this can be followed up with ENT (ear nose and throat.)  You can use ice to the affected area and Tylenol as needed for pain.  Follow-up with your doctor.  Return to the emergency department if any worsening or concerning symptoms

## 2022-01-18 NOTE — ED Triage Notes (Signed)
States was rushing around this morning and tripped and hit left eye on corner of dresser. C/o headache. Swelling/bruising to eye. Laceration to left eyebrow. Denies vision change/LOC or thinners.

## 2022-03-08 ENCOUNTER — Ambulatory Visit: Payer: Medicare HMO | Admitting: Podiatry

## 2022-03-08 ENCOUNTER — Encounter: Payer: Self-pay | Admitting: Podiatry

## 2022-03-08 VITALS — BP 132/72

## 2022-03-08 DIAGNOSIS — B351 Tinea unguium: Secondary | ICD-10-CM | POA: Diagnosis not present

## 2022-03-08 DIAGNOSIS — E119 Type 2 diabetes mellitus without complications: Secondary | ICD-10-CM

## 2022-03-08 DIAGNOSIS — M79675 Pain in left toe(s): Secondary | ICD-10-CM

## 2022-03-08 DIAGNOSIS — M79674 Pain in right toe(s): Secondary | ICD-10-CM | POA: Diagnosis not present

## 2022-03-08 NOTE — Progress Notes (Signed)
Subjective: Adrian Castillo presents today referred by Willene Hatchet, NP for diabetic foot evaluation.  Patient relates 1 year history of diabetes.  Patient denies any history of foot wounds.  Patient denies any history of numbness, tingling, burning, pins/needles sensations.  Past Medical History:  Diagnosis Date   Diabetes mellitus without complication ([place])    Hep C w/o coma, chronic ([employer])    Heroin addiction ([place])    Hypertension     Patient Active Problem List   Diagnosis Date Noted   Cellulitis 01/07/2022   IVDU (intravenous drug user) 01/07/2022   HTN (hypertension) 01/07/2022   Type 2 diabetes mellitus ([place]) 01/07/2022    No past surgical history on file.  Current Outpatient Medications on File Prior to Visit  Medication Sig Dispense Refill   albuterol (VENTOLIN HFA) 108 (90 Base) MCG/ACT inhaler Inhale 2 puffs into the lungs every 4 (four) hours as needed for wheezing or shortness of breath. (Patient not taking: Reported on 01/07/2022) 18 g 1   amLODipine (NORVASC) 10 MG tablet Take 1 tablet (10 mg total) by mouth daily. 90 tablet 0   atorvastatin (LIPITOR) 20 MG tablet Take 20 mg by mouth daily.     FARXIGA 5 MG TABS tablet Take 5 mg by mouth daily.     losartan (COZAAR) 50 MG tablet Take 1 tablet (50 mg total) by mouth daily. 30 tablet 0   naloxone (NARCAN) nasal spray 4 mg/0.1 mL Use one spray in the nostril in the case of an overdose (Patient not taking: Reported on 10/01/2020) 1 each 0   SYMBICORT 160-4.5 MCG/ACT inhaler  (Patient not taking: Reported on 01/07/2022)     No current facility-administered medications on file prior to visit.     Allergies  Allergen Reactions   Crab [Shellfish Allergy] Hives    Soft crab (receives TOO MUCH iodine) eats hard-shell crap with no issues   Tylenol [Acetaminophen] Hives    Social History   Occupational History   Not on file  Tobacco Use   Smoking status: Every Day    Packs/day: 0.50    Types: Cigarettes    Smokeless tobacco: Never  Substance and Sexual Activity   Alcohol use: No   Drug use: No    Types: IV    Comment: HEROIN   Sexual activity: Not on file    No family history on file.  Immunization History  Administered Date(s) Administered   Influenza,inj,Quad PF,6+ Mos 01/01/2016, 12/15/2020   Moderna Sars-Covid-2 Vaccination 12/03/2020   PFIZER Comirnaty(Gray Top)Covid-19 Tri-Sucrose Vaccine 05/16/2019, 06/06/2019   PNEUMOCOCCAL CONJUGATE-20 12/15/2020   Rabies, IM 06/12/2015, 06/15/2015, 06/19/2015   Tdap 06/12/2015    Review of systems: Positive Findings in bold print.  Constitutional:  chills, fatigue, fever, sweats, weight change Communication: Optometrist, sign Ecologist, hand writing, iPad/Android device Head: headaches, head injury Eyes: changes in vision, eye pain, glaucoma, cataracts, macular degeneration, diplopia, glare,  light sensitivity, eyeglasses or contacts, blindness Ears nose mouth throat: hearing impaired, hearing aids,  ringing in ears, deaf, sign language,  vertigo, nosebleeds,  rhinitis,  cold sores, snoring, swollen glands Cardiovascular: HTN, edema, arrhythmia, pacemaker in place, defibrillator in place, chest pain/tightness, chronic anticoagulation, blood clot, heart failure, MI Peripheral Vascular: leg cramps, varicose veins, blood clots, lymphedema, varicosities Respiratory:  asthma, difficulty breathing, denies congestion, SOB, wheezing, cough, emphysema Gastrointestinal: change in appetite or weight, abdominal pain, constipation, diarrhea, nausea, vomiting, vomiting blood, change in bowel habits, abdominal pain, jaundice, rectal bleeding, hemorrhoids, GERD Genitourinary:  nocturia,  pain on urination, polyuria,  blood in urine, Foley catheter, urinary urgency, ESRD on hemodialysis Musculoskeletal: amputation, cramping, stiff joints, painful joints, decreased joint motion, fractures, OA, gout, hemiplegia, paraplegia, uses cane, wheelchair bound,  uses walker, uses rollator Skin: +changes in toenails, color change, dryness, itching, mole changes,  rash, wound(s) Neurological: headaches, numbness in feet, paresthesias in feet, burning in feet, fainting,  seizures, change in speech, migraines, memory problems/poor historian, cerebral palsy, weakness, paralysis, CVA, TIA Endocrine: diabetes, hypothyroidism, hyperthyroidism,  goiter, dry mouth, flushing, heat intolerance, cold intolerance,  excessive thirst, denies polyuria,  nocturia Hematological:  easy bleeding, excessive bleeding, easy bruising, enlarged lymph nodes, on long term blood thinner, history of past transusions Allergy/immunological:  hives, eczema, frequent infections, multiple drug allergies, seasonal allergies, transplant recipient, multiple food allergies Psychiatric:  anxiety, depression, mood disorder, suicidal ideations, hallucinations, insomnia  Objective: Vitals:   03/08/22 1512  BP: 132/72   Vascular Examination: Capillary refill time less than 3 seconds x 10 digits.  Dorsalis pedis pulses palpable 2 out of 4.  Posterior tibial pulses palpable 2 out of 4.  Digital hair not present x 10 digits.  Skin temperature gradient WNL b/l.  Dermatological Examination: Skin with normal turgor, texture and tone b/l  Toenails 1-5 b/l discolored, thick, dystrophic with subungual debris and pain with palpation to nailbeds due to thickness of nails.  Musculoskeletal: Muscle strength 5/5 to all LE muscle groups.  Neurological: Sensation intact with 10 gram monofilament.  Vibratory sensation intact.  Assessment: NIDDM Encounter for diabetic foot examination  Plan: Discussed diabetic foot care principles. Literature dispensed on today. Patient to continue soft, supportive shoe gear daily. Patient to report any pedal injuries to medical professional immediately. Follow up one year. Patient/POA to call should there be a concern in the interim.  Patient was  evaluated and treated and all questions answered.  Onychomycosis with pain  -Nails palliatively debrided as below. -Educated on self-care  Procedure: Nail Debridement Rationale: pain  Type of Debridement: manual, sharp debridement. Instrumentation: Nail nipper, rotary burr. Number of Nails: 10  Procedures and Treatment: Consent by patient was obtained for treatment procedures. The patient understood the discussion of treatment and procedures well. All questions were answered thoroughly reviewed. Debridement of mycotic and hypertrophic toenails, 1 through 5 bilateral and clearing of subungual debris. No ulceration, no infection noted.  Return Visit-Office Procedure: Patient instructed to return to the office for a follow up visit 3 months for continued evaluation and treatment.  Boneta Lucks, DPM    Return in about 3 months (around 06/07/2022) for Routine Foot Care.

## 2022-04-22 HISTORY — PX: EYE SURGERY: SHX253

## 2022-05-31 ENCOUNTER — Other Ambulatory Visit: Payer: Self-pay | Admitting: Otolaryngology

## 2022-05-31 NOTE — Pre-Procedure Instructions (Signed)
Surgical Instructions    Your procedure is scheduled on June 09, 2022.  Report to [facility] Main Entrance "A" at 11:45 A.M., then check in with the Admitting office.  Call this number if you have problems the morning of surgery:  [phone]  If you have any questions prior to your surgery date call [phone]: Open Monday-Friday 8am-4pm If you experience any cold or flu symptoms such as cough, fever, chills, shortness of breath, etc. between now and your scheduled surgery, please notify us at the above number.     Remember:  Do not eat after midnight the night before your surgery  You may drink clear liquids until 10:45 AM the morning of your surgery.   Clear liquids allowed are: Water, Non-Citrus Juices (without pulp), Carbonated Beverages, Clear Tea, Black Coffee Only (NO MILK, CREAM OR POWDERED CREAMER of any kind), and Gatorade.     Take these medicines the morning of surgery with A SIP OF WATER:  amLODipine (NORVASC)   atorvastatin (LIPITOR)   Brinzolamide-Brimonidine (SIMBRINZA) eye drops  ketorolac (ACULAR) ophthalmic solution   moxifloxacin (VIGAMOX) ophthalmic solution   prednisoLONE acetate (PRED FORTE) ophthalmic suspension     May take these medicines IF NEEDED:  albuterol (VENTOLIN HFA) inhaler   naloxone (NARCAN) nasal spray     As of today, STOP taking any Aspirin (unless otherwise instructed by your surgeon) Aleve, Naproxen, Ibuprofen, Motrin, Advil, Goody's, BC's, all herbal medications, fish oil, and all vitamins.   WHAT DO I DO ABOUT MY DIABETES MEDICATION?   STOP taking your FARXIGA three days prior to surgery. Your last dose will be April 14th.     HOW TO MANAGE YOUR DIABETES BEFORE AND AFTER SURGERY  Why is it important to control my blood sugar before and after surgery? Improving blood sugar levels before and after surgery helps healing and can limit problems. A way of improving blood sugar control is eating a healthy diet by:  Eating less  sugar and carbohydrates  Increasing activity/exercise  Talking with your doctor about reaching your blood sugar goals High blood sugars (greater than 180 mg/dL) can raise your risk of infections and slow your recovery, so you will need to focus on controlling your diabetes during the weeks before surgery. Make sure that the doctor who takes care of your diabetes knows about your planned surgery including the date and location.  How do I manage my blood sugar before surgery? Check your blood sugar at least 4 times a day, starting 2 days before surgery, to make sure that the level is not too high or low.  Check your blood sugar the morning of your surgery when you wake up and every 2 hours until you get to the Short Stay unit.  If your blood sugar is less than 70 mg/dL, you will need to treat for low blood sugar: Do not take insulin. Treat a low blood sugar (less than 70 mg/dL) with  cup of clear juice (cranberry or apple), 4 glucose tablets, OR glucose gel. Recheck blood sugar in 15 minutes after treatment (to make sure it is greater than 70 mg/dL). If your blood sugar is not greater than 70 mg/dL on recheck, call [phone] for further instructions. Report your blood sugar to the short stay nurse when you get to Short Stay.  If you are admitted to the hospital after surgery: Your blood sugar will be checked by the staff and you will probably be given insulin after surgery (instead of oral diabetes medicines) to  make sure you have good blood sugar levels. The goal for blood sugar control after surgery is 80-180 mg/dL.                      Do NOT Smoke (Tobacco/Vaping) for 24 hours prior to your procedure.  If you use a CPAP at night, you may bring your mask/headgear for your overnight stay.   Contacts, glasses, piercing's, hearing aid's, dentures or partials may not be worn into surgery, please bring cases for these belongings.    For patients admitted to the hospital, discharge time  will be determined by your treatment team.   Patients discharged the day of surgery will not be allowed to drive home, and someone needs to stay with them for 24 hours.  SURGICAL WAITING ROOM VISITATION Patients having surgery or a procedure may have no more than 2 support people in the waiting area - these visitors may rotate.   Children under the age of 34 must have an adult with them who is not the patient. If the patient needs to stay at the hospital during part of their recovery, the visitor guidelines for inpatient rooms apply. Pre-op nurse will coordinate an appropriate time for 1 support person to accompany patient in pre-op.  This support person may not rotate.   Please refer to the [facility] website for the visitor guidelines for Inpatients (after your surgery is over and you are in a regular room).    Special instructions:   [place]- Preparing For Surgery  Before surgery, you can play an important role. Because skin is not sterile, your skin needs to be as free of germs as possible. You can reduce the number of germs on your skin by washing with CHG (chlorahexidine gluconate) Soap before surgery.  CHG is an antiseptic cleaner which kills germs and bonds with the skin to continue killing germs even after washing.    Oral Hygiene is also important to reduce your risk of infection.  Remember - BRUSH YOUR TEETH THE MORNING OF SURGERY WITH YOUR REGULAR TOOTHPASTE  Please do not use if you have an allergy to CHG or antibacterial soaps. If your skin becomes reddened/irritated stop using the CHG.  Do not shave (including legs and underarms) for at least 48 hours prior to first CHG shower. It is OK to shave your face.  Please follow these instructions carefully.   Shower the NIGHT BEFORE SURGERY and the MORNING OF SURGERY  If you chose to wash your hair, wash your hair first as usual with your normal shampoo.  After you shampoo, rinse your hair and body thoroughly to remove the  shampoo.  Use CHG Soap as you would any other liquid soap. You can apply CHG directly to the skin and wash gently with a scrungie or a clean washcloth.   Apply the CHG Soap to your body ONLY FROM THE NECK DOWN.  Do not use on open wounds or open sores. Avoid contact with your eyes, ears, mouth and genitals (private parts). Wash Face and genitals (private parts)  with your normal soap.   Wash thoroughly, paying special attention to the area where your surgery will be performed.  Thoroughly rinse your body with warm water from the neck down.  DO NOT shower/wash with your normal soap after using and rinsing off the CHG Soap.  Pat yourself dry with a CLEAN TOWEL.  Wear CLEAN PAJAMAS to bed the night before surgery  Place CLEAN SHEETS on your bed  the night before your surgery  DO NOT SLEEP WITH PETS.   Day of Surgery: Take a shower with CHG soap. Do not wear jewelry or makeup Do not wear lotions, powders, perfumes/colognes, or deodorant. Do not shave 48 hours prior to surgery.  Men may shave face and neck. Do not bring valuables to the hospital.  [facility] is not responsible for any belongings or valuables. Do not wear nail polish, gel polish, artificial nails, or any other type of covering on natural nails (fingers and toes) If you have artificial nails or gel coating that need to be removed by a nail salon, please have this removed prior to surgery. Artificial nails or gel coating may interfere with anesthesia's ability to adequately monitor your vital signs.  Wear Clean/Comfortable clothing the morning of surgery Remember to brush your teeth WITH YOUR REGULAR TOOTHPASTE.   Please read over the following fact sheets that you were given.    If you received a COVID test during your pre-op visit  it is requested that you wear a mask when out in public, stay away from anyone that may not be feeling well and notify your surgeon if you develop symptoms. If you have been in contact with  anyone that has tested positive in the last 10 days please notify you surgeon.

## 2022-06-01 ENCOUNTER — Inpatient Hospital Stay (HOSPITAL_COMMUNITY)
Admission: RE | Admit: 2022-06-01 | Discharge: 2022-06-01 | Disposition: A | Discharge status: Home / Self Care | Payer: Medicare Other | Source: Ambulatory Visit

## 2022-06-01 NOTE — Progress Notes (Signed)
Patient had PAT appointment today at 11:00am but was a no show. Attempted to call the pt but voicemail was full and unable to leave message.

## 2022-06-03 ENCOUNTER — Other Ambulatory Visit: Payer: Self-pay | Admitting: Otolaryngology

## 2022-06-06 ENCOUNTER — Encounter (HOSPITAL_COMMUNITY): Payer: Self-pay

## 2022-06-06 ENCOUNTER — Other Ambulatory Visit: Payer: Self-pay

## 2022-06-06 ENCOUNTER — Encounter (HOSPITAL_COMMUNITY)
Admission: RE | Admit: 2022-06-06 | Discharge: 2022-06-06 | Disposition: A | Discharge status: Home / Self Care | Payer: Medicare HMO | Source: Ambulatory Visit | Attending: Otolaryngology | Admitting: Otolaryngology

## 2022-06-06 VITALS — HR 81 | Temp 98.4°F | Resp 18 | Ht 71.0 in | Wt 168.5 lb

## 2022-06-06 DIAGNOSIS — Z8619 Personal history of other infectious and parasitic diseases: Secondary | ICD-10-CM | POA: Insufficient documentation

## 2022-06-06 DIAGNOSIS — L03113 Cellulitis of right upper limb: Secondary | ICD-10-CM | POA: Insufficient documentation

## 2022-06-06 DIAGNOSIS — F17210 Nicotine dependence, cigarettes, uncomplicated: Secondary | ICD-10-CM | POA: Diagnosis not present

## 2022-06-06 DIAGNOSIS — I10 Essential (primary) hypertension: Secondary | ICD-10-CM | POA: Diagnosis not present

## 2022-06-06 DIAGNOSIS — Z7984 Long term (current) use of oral hypoglycemic drugs: Secondary | ICD-10-CM | POA: Diagnosis not present

## 2022-06-06 DIAGNOSIS — D140 Benign neoplasm of middle ear, nasal cavity and accessory sinuses: Secondary | ICD-10-CM | POA: Diagnosis not present

## 2022-06-06 DIAGNOSIS — E119 Type 2 diabetes mellitus without complications: Secondary | ICD-10-CM | POA: Diagnosis not present

## 2022-06-06 DIAGNOSIS — Z01818 Encounter for other preprocedural examination: Secondary | ICD-10-CM | POA: Diagnosis present

## 2022-06-06 DIAGNOSIS — B9689 Other specified bacterial agents as the cause of diseases classified elsewhere: Secondary | ICD-10-CM | POA: Diagnosis not present

## 2022-06-06 HISTORY — DX: Other psychoactive substance abuse, uncomplicated: F19.10

## 2022-06-06 LAB — COMPREHENSIVE METABOLIC PANEL
ALT: 16 U/L (ref 0–44)
AST: 19 U/L (ref 15–41)
Albumin: 3.7 g/dL (ref 3.5–5.0)
Alkaline Phosphatase: 59 U/L (ref 38–126)
Anion gap: 6 (ref 5–15)
BUN: 12 mg/dL (ref 8–23)
CO2: 29 mmol/L (ref 22–32)
Calcium: 9.1 mg/dL (ref 8.9–10.3)
Chloride: 102 mmol/L (ref 98–111)
Creatinine, Ser: 0.95 mg/dL (ref 0.61–1.24)
GFR, Estimated: >60 mL/min (ref >60)
Glucose, Bld: 131 mg/dL — ABNORMAL HIGH (ref 70–99)
Potassium: 4.2 mmol/L (ref 3.5–5.1)
Sodium: 137 mmol/L (ref 135–145)
Total Bilirubin: 0.7 mg/dL (ref 0.3–1.2)
Total Protein: 7.4 g/dL (ref 6.5–8.1)

## 2022-06-06 LAB — CBC
HCT: 42.9 % (ref 39.0–52.0)
Hemoglobin: 13.3 g/dL (ref 13.0–17.0)
MCH: 25.4 pg — ABNORMAL LOW (ref 26.0–34.0)
MCHC: 31 g/dL (ref 30.0–36.0)
MCV: 81.9 fL (ref 80.0–100.0)
Platelets: 151 10*3/uL (ref 150–400)
RBC: 5.24 MIL/uL (ref 4.22–5.81)
RDW: 13.8 % (ref 11.5–15.5)
WBC: 4.3 10*3/uL (ref 4.0–10.5)
nRBC: 0 % (ref 0.0–0.2)

## 2022-06-06 LAB — GLUCOSE, CAPILLARY: Glucose-Capillary: 140 mg/dL — ABNORMAL HIGH (ref 70–99)

## 2022-06-06 LAB — HEMOGLOBIN A1C
Hgb A1c MFr Bld: 7.8 % — ABNORMAL HIGH (ref 4.8–5.6)
Mean Plasma Glucose: 177.16 mg/dL

## 2022-06-06 NOTE — Progress Notes (Addendum)
Error

## 2022-06-06 NOTE — Progress Notes (Signed)
Anesthesia APP Evaluation:  Case: [id number] Date/Time: 06/09/22 1330   Procedure: FUNCTIONAL ENDOSCOPIC SINUS SURGERY; MODIFIED MAXILLECTOMY AND ANTERIOR ETHMOIDECTOMY WITH FUSION (Left)   Anesthesia type: General   Pre-op diagnosis: Benign neoplasm of middle ear, nasal cavity and accessory sinuses   Location: [facility] OR ROOM 08 / [facility] OR   Surgeons: Scarlette Ar, MD       DISCUSSION: Adrian Castillo is a 67 year old male scheduled for the above procedure.  History includes smoking, DM2, hepatitis C (s/p treatment with Epclusa in 2018 at [facility]), HTN, drug addiction (s/p rehab 2018, with relapse).  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  Currently says Adrian Castillo does IV fentanyl once a day to prevent withdrawals.  Adrian Castillo occasionally smokes cocaine, last around 06/04/22. Adrian Castillo denied nasal route for drug use. Adrian Castillo smokes about 1/2 pack of cigarettes per day.  Adrian Castillo does not drink alcohol.  Adrian Castillo has bilateral upper extremity track marks, and says Adrian Castillo primarily uses his arms for IV drug use.  There are several areas of associated erythema, but most pronounced along the dorsal right arm below the elbow. Adrian Castillo says Adrian Castillo just completed doxycycline for RUE cellulitis and feels it is stable. Adrian Castillo declined re-evaluation at the moment and prefers to monitor because Adrian Castillo feels it is unchanged. It is not draining. Adrian Castillo says they were able to get PIV access for his left eye surgery just a few weeks ago at the [facility] with Dr. Sinda Du.  A1c 7.8%. Adrian Castillo does really check home blood glucose levels. Adrian Castillo takes [place] 5 mg daily, last dose 06/06/22.   Adrian Castillo said Adrian Castillo recently underwent left eye surgery with cataract extraction and lens implant. Adrian Castillo says Adrian Castillo was told to patch his left eye for surgery. Adrian Castillo is on Pred Forte 1%, Acular 0.5%, and Vigamox 0.5% 1 drop OS QID and Simbrinza 1-0.2% 1 drop OS BID.   Adrian Castillo does have a mild nasal quality to his voice. Adrian Castillo indicates Adrian Castillo has allergies, and denied sick  symptoms such as fever, cough, sore throat. There is no nasal drainage. Lung sounds are clear. Adrian Castillo appears well appearing.   Drug use and arm exam findings dicussed with anesthesiologist Adonis Huguenin, MD and/or Leslye Peer, MD. Adrian Castillo will refrain from cocaine. Advised that Adrian Castillo should not arrive for surgery acutely under the influence of a substance. Adrian Castillo was encouraged to seek additional treatment for right arm cellulitis (reported completed doxycycline 2-3 days prior). Advised that Adrian Castillo should be well for surgery. I also spoke with Albin Felling at Dr. Erskine Speed' office and left a message for his scheduler Rinaldo Cloud regarding drug use and recently treated but on-going appearance of right arm cellulitis.   Anesthesia team to evaluate on the day of surgery.  Definitive plan at that time.   VS: Pulse 81   Temp 36.9 C (Oral)   Resp 18   Ht 5\' 11"  (1.803 m)   Wt 76.4 kg   SpO2 98%   BMI 23.50 kg/m  BP Readings from Last 3 Encounters:  03/08/22 132/72  01/18/22 125/79  01/10/22 136/89   Adrian Castillo reported full dentures. Lungs sounds clear. Heart RRR, no murmur noted.   PROVIDERS: Estevan Oaks, NP is PCP [facility])   LABS: Labs reviewed: Acceptable for surgery. (all labs ordered are listed, but only abnormal results are displayed)  Labs Reviewed  GLUCOSE, CAPILLARY - Abnormal; Notable for the following components:      Result Value  Glucose-Capillary 140 (*)    All other components within normal limits  CBC - Abnormal; Notable for the following components:   MCH 25.4 (*)    All other components within normal limits  COMPREHENSIVE METABOLIC PANEL - Abnormal; Notable for the following components:   Glucose, Bld 131 (*)    All other components within normal limits  HEMOGLOBIN A1C - Abnormal; Notable for the following components:   Hgb A1c MFr Bld 7.8 (*)    All other components within normal limits     IMAGES: CT head & Maxillofacial 01/18/22: IMPRESSION: 1. No  acute intracranial abnormality. 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 Negative for facial fracture. Mild soft tissue swelling left eyelid. 2. 11 x 22 mm lesion in the left nasal cavity projecting into the left maxillary sinus. This has enlarged since the prior study. This could be a polyp or mucocele. Recommend ENT evaluation.   EKG: Sinus bradycardia at 49 bpm Otherwise normal ECG When compared with ECG of 31-Mar-2020 15:55, Since last tracing rate slower Confirmed by [place], Peter [phone]) on 06/06/2022 4:35:34 PM   CV: N/A   Past Medical History:  Diagnosis Date   Diabetes mellitus without complication    type 2   Hep C w/o coma, chronic 06/2016   treated at 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    Past Surgical History:  Procedure Laterality Date   COLONOSCOPY     EYE SURGERY Left 04/2022   lens implant @ [facility]    MEDICATIONS:  FARXIGA 5 MG TABS tablet   FENTANYL CITRATE NA   albuterol (VENTOLIN HFA) 108 (90 Base) MCG/ACT inhaler   amLODipine (NORVASC) 10 MG tablet   atorvastatin (LIPITOR) 20 MG tablet   Brinzolamide-Brimonidine (SIMBRINZA) 1-0.2 % SUSP   cholecalciferol (VITAMIN D3) 25 MCG (1000 UNIT) tablet   ketorolac (ACULAR) 0.5 % ophthalmic solution   losartan (COZAAR) 50 MG tablet   moxifloxacin (VIGAMOX) 0.5 % ophthalmic solution   naloxone (NARCAN) nasal spray 4 mg/0.1 mL   prednisoLONE acetate (PRED FORTE) 1 % ophthalmic suspension   No current facility-administered medications for this encounter.    Shonna Chock, PA-C Surgical Short Stay/Anesthesiology [facility] Phone [phone] [facility] Phone [phone] 06/07/2022 11:20 AM

## 2022-06-06 NOTE — Progress Notes (Signed)
PCP - Dr Manson Passey @ [facility]  Cardiologist - n/a  Chest x-ray - n/a EKG - 06/06/22 Stress Test - n/a ECHO - n/a Cardiac Cath - n/a  ICD Pacemaker/Loop - n/a  Sleep Study -  n/a CPAP - none  Diabetes Type 2, does not check his blood sugar  Last dose of Farxiga was on 06/06/22.  Uses IV Heroin daily.  Last use of IV Heroin use was today, 06/06/22.  Revonda Standard, [place], Anesthesia was informed.  Revonda Standard, [place] met with patient in [place].  Patient stated that he also uses cocaine.  ERAS: Clear liquids til 10:45 AM  Anesthesia review: Yes  STOP now taking any Aspirin (unless otherwise instructed by your surgeon), Aleve, Naproxen, Ibuprofen, Motrin, Advil, Goody's, BC's, all herbal medications, fish oil, and all vitamins.   Coronavirus Screening Do you have any of the following symptoms:  Cough yes/no: No Fever (>100.9F)  yes/no: No Runny nose yes/no: No Sore throat yes/no: No Difficulty breathing/shortness of breath  yes/no: No  Have you traveled in the last 14 days and where? yes/no: No  Patient verbalized understanding of instructions that were given to them at the PAT appointment. Patient was also instructed that they will need to review over the PAT instructions again at home before surgery.

## 2022-06-07 ENCOUNTER — Ambulatory Visit: Payer: Medicare HMO | Admitting: Podiatry

## 2022-06-07 NOTE — Anesthesia Preprocedure Evaluation (Signed)
Anesthesia Evaluation  Patient identified by MRN, date of birth, ID band Patient awake    Reviewed: Allergy & Precautions, NPO status , Patient's Chart, lab work & pertinent test results  Airway Mallampati: II  TM Distance: >3 FB Neck ROM: Full    Dental  (+) Edentulous Upper, Edentulous Lower   Pulmonary Current Smoker and Patient abstained from smoking.   Pulmonary exam normal        Cardiovascular hypertension, Pt. on medications Normal cardiovascular exam     Neuro/Psych negative neurological ROS  negative psych ROS   GI/Hepatic negative GI ROS,,,(+)     substance abuse  , Hepatitis -  Endo/Other  diabetes    Renal/GU negative Renal ROS     Musculoskeletal negative musculoskeletal ROS (+)    Abdominal   Peds  Hematology negative hematology ROS (+)   Anesthesia Other Findings Benign neoplasm of middle ear, nasal cavity and accessory sinuses  Reproductive/Obstetrics                             Anesthesia Physical Anesthesia Plan  ASA: 3  Anesthesia Plan: General   Post-op Pain Management:    Induction: Intravenous  PONV Risk Score and Plan: 1 and Ondansetron, Dexamethasone, Midazolam and Treatment may vary due to age or medical condition  Airway Management Planned: Oral ETT  Additional Equipment:   Intra-op Plan:   Post-operative Plan: Extubation in OR  Informed Consent: I have reviewed the patients History and Physical, chart, labs and discussed the procedure including the risks, benefits and alternatives for the proposed anesthesia with the patient or authorized representative who has indicated his/her understanding and acceptance.     Dental advisory given  Plan Discussed with: CRNA  Anesthesia Plan Comments: (PAT note written 06/07/2022 by Shonna Chock, PA-C. . )       Anesthesia Quick Evaluation

## 2022-06-09 ENCOUNTER — Ambulatory Visit (HOSPITAL_COMMUNITY)
Admission: RE | Admit: 2022-06-09 | Discharge: 2022-06-09 | Disposition: A | Discharge status: Home / Self Care | Payer: Medicare HMO | Attending: Otolaryngology | Admitting: Otolaryngology

## 2022-06-09 ENCOUNTER — Other Ambulatory Visit: Payer: Self-pay

## 2022-06-09 ENCOUNTER — Encounter (HOSPITAL_COMMUNITY): Payer: Self-pay | Admitting: Otolaryngology

## 2022-06-09 ENCOUNTER — Ambulatory Visit (HOSPITAL_BASED_OUTPATIENT_CLINIC_OR_DEPARTMENT_OTHER): Payer: Medicare HMO

## 2022-06-09 ENCOUNTER — Encounter (HOSPITAL_COMMUNITY)
Admission: RE | Disposition: A | Discharge status: Home / Self Care | Payer: Self-pay | Source: Home / Self Care | Attending: Otolaryngology

## 2022-06-09 ENCOUNTER — Ambulatory Visit (HOSPITAL_COMMUNITY): Payer: Medicare HMO | Admitting: Vascular Surgery

## 2022-06-09 DIAGNOSIS — J329 Chronic sinusitis, unspecified: Secondary | ICD-10-CM | POA: Insufficient documentation

## 2022-06-09 DIAGNOSIS — D140 Benign neoplasm of middle ear, nasal cavity and accessory sinuses: Secondary | ICD-10-CM | POA: Diagnosis not present

## 2022-06-09 DIAGNOSIS — F17210 Nicotine dependence, cigarettes, uncomplicated: Secondary | ICD-10-CM | POA: Insufficient documentation

## 2022-06-09 DIAGNOSIS — J341 Cyst and mucocele of nose and nasal sinus: Secondary | ICD-10-CM | POA: Diagnosis not present

## 2022-06-09 DIAGNOSIS — B182 Chronic viral hepatitis C: Secondary | ICD-10-CM | POA: Diagnosis not present

## 2022-06-09 DIAGNOSIS — E119 Type 2 diabetes mellitus without complications: Secondary | ICD-10-CM | POA: Diagnosis not present

## 2022-06-09 DIAGNOSIS — I10 Essential (primary) hypertension: Secondary | ICD-10-CM | POA: Diagnosis not present

## 2022-06-09 HISTORY — PX: SINUS ENDO WITH FUSION: SHX5329

## 2022-06-09 LAB — GLUCOSE, CAPILLARY
Glucose-Capillary: 138 mg/dL — ABNORMAL HIGH (ref 70–99)
Glucose-Capillary: 149 mg/dL — ABNORMAL HIGH (ref 70–99)

## 2022-06-09 SURGERY — SURGERY, PARANASAL SINUS, ENDOSCOPIC, WITH NASAL SEPTOPLASTY, TURBINOPLASTY, AND MAXILLARY SINUSOTOMY
Anesthesia: General | Laterality: Left

## 2022-06-09 MED ORDER — MIDAZOLAM HCL 2 MG/2ML IJ SOLN
INTRAMUSCULAR | Status: DC | PRN
  Administered 2022-06-09: 2 mg via INTRAVENOUS

## 2022-06-09 MED ORDER — EPINEPHRINE HCL (NASAL) 0.1 % NA SOLN
NASAL | Status: AC
  Filled 2022-06-09: qty 90

## 2022-06-09 MED ORDER — ROCURONIUM BROMIDE 10 MG/ML (PF) SYRINGE
PREFILLED_SYRINGE | INTRAVENOUS | Status: DC | PRN
  Administered 2022-06-09: 20 mg via INTRAVENOUS
  Administered 2022-06-09: 40 mg via INTRAVENOUS

## 2022-06-09 MED ORDER — FENTANYL CITRATE (PF) 250 MCG/5ML IJ SOLN
INTRAMUSCULAR | Status: AC
  Filled 2022-06-09: qty 5

## 2022-06-09 MED ORDER — CHLORHEXIDINE GLUCONATE 0.12 % MT SOLN
15.0000 mL | Freq: Once | OROMUCOSAL | Status: AC
  Administered 2022-06-09: 15 mL via OROMUCOSAL

## 2022-06-09 MED ORDER — PROPOFOL 1000 MG/100ML IV EMUL
INTRAVENOUS | Status: AC
  Filled 2022-06-09: qty 100

## 2022-06-09 MED ORDER — IBUPROFEN 200 MG PO TABS: 400.0000 mg | ORAL_TABLET | Freq: Four times a day (QID) | ORAL | 0 refills | Status: AC | PRN

## 2022-06-09 MED ORDER — ONDANSETRON HCL 4 MG/2ML IJ SOLN
INTRAMUSCULAR | Status: AC
  Filled 2022-06-09: qty 2

## 2022-06-09 MED ORDER — GLYCOPYRROLATE PF 0.2 MG/ML IJ SOSY
PREFILLED_SYRINGE | INTRAMUSCULAR | Status: DC | PRN
  Administered 2022-06-09: .1 mg via INTRAVENOUS

## 2022-06-09 MED ORDER — GLYCOPYRROLATE PF 0.2 MG/ML IJ SOSY
PREFILLED_SYRINGE | INTRAMUSCULAR | Status: AC
  Filled 2022-06-09: qty 1

## 2022-06-09 MED ORDER — ORAL CARE MOUTH RINSE: 15.0000 mL | Freq: Once | OROMUCOSAL | Status: AC

## 2022-06-09 MED ORDER — PROPOFOL 10 MG/ML IV BOLUS
INTRAVENOUS | Status: DC | PRN
  Administered 2022-06-09: 150 mg via INTRAVENOUS

## 2022-06-09 MED ORDER — DEXAMETHASONE SODIUM PHOSPHATE 10 MG/ML IJ SOLN
INTRAMUSCULAR | Status: DC | PRN
  Administered 2022-06-09: 10 mg via INTRAVENOUS

## 2022-06-09 MED ORDER — EPINEPHRINE HCL (NASAL) 0.1 % NA SOLN
NASAL | Status: DC | PRN
  Administered 2022-06-09: 20 mL via NASAL

## 2022-06-09 MED ORDER — FENTANYL CITRATE (PF) 100 MCG/2ML IJ SOLN
25.0000 ug | INTRAMUSCULAR | Status: DC | PRN
  Administered 2022-06-09 (×2): 50 ug via INTRAVENOUS

## 2022-06-09 MED ORDER — ROCURONIUM BROMIDE 10 MG/ML (PF) SYRINGE
PREFILLED_SYRINGE | INTRAVENOUS | Status: AC
  Filled 2022-06-09: qty 10

## 2022-06-09 MED ORDER — KETOROLAC TROMETHAMINE 15 MG/ML IJ SOLN
INTRAMUSCULAR | Status: AC
  Filled 2022-06-09: qty 1

## 2022-06-09 MED ORDER — SUGAMMADEX SODIUM 200 MG/2ML IV SOLN
INTRAVENOUS | Status: DC | PRN
  Administered 2022-06-09: 160 mg via INTRAVENOUS

## 2022-06-09 MED ORDER — PROPOFOL 500 MG/50ML IV EMUL
INTRAVENOUS | Status: DC | PRN
  Administered 2022-06-09: 150 ug/kg/min via INTRAVENOUS

## 2022-06-09 MED ORDER — FENTANYL CITRATE (PF) 100 MCG/2ML IJ SOLN
INTRAMUSCULAR | Status: DC | PRN
  Administered 2022-06-09: 50 ug via INTRAVENOUS
  Administered 2022-06-09: 100 ug via INTRAVENOUS
  Administered 2022-06-09 (×2): 50 ug via INTRAVENOUS

## 2022-06-09 MED ORDER — DEXAMETHASONE SODIUM PHOSPHATE 10 MG/ML IJ SOLN
INTRAMUSCULAR | Status: AC
  Filled 2022-06-09: qty 1

## 2022-06-09 MED ORDER — LIDOCAINE 2% (20 MG/ML) 5 ML SYRINGE
INTRAMUSCULAR | Status: DC | PRN
  Administered 2022-06-09: 60 mg via INTRAVENOUS

## 2022-06-09 MED ORDER — MUPIROCIN 2 % EX OINT
TOPICAL_OINTMENT | CUTANEOUS | Status: AC
  Filled 2022-06-09: qty 22

## 2022-06-09 MED ORDER — LIDOCAINE 2% (20 MG/ML) 5 ML SYRINGE
INTRAMUSCULAR | Status: AC
  Filled 2022-06-09: qty 5

## 2022-06-09 MED ORDER — FLUORESCEIN SODIUM 1 MG OP STRP
ORAL_STRIP | OPHTHALMIC | Status: DC | PRN
  Administered 2022-06-09: 2

## 2022-06-09 MED ORDER — PROPOFOL 10 MG/ML IV BOLUS
INTRAVENOUS | Status: AC
  Filled 2022-06-09: qty 20

## 2022-06-09 MED ORDER — MIDAZOLAM HCL 2 MG/2ML IJ SOLN
INTRAMUSCULAR | Status: AC
  Filled 2022-06-09: qty 2

## 2022-06-09 MED ORDER — SALINE SPRAY 0.65 % NA SOLN: 2.0000 | Freq: Four times a day (QID) | NASAL | 3 refills | Status: AC

## 2022-06-09 MED ORDER — LIDOCAINE-EPINEPHRINE 1 %-1:100000 IJ SOLN
INTRAMUSCULAR | Status: AC
  Filled 2022-06-09: qty 1

## 2022-06-09 MED ORDER — FENTANYL CITRATE (PF) 100 MCG/2ML IJ SOLN
INTRAMUSCULAR | Status: AC
  Filled 2022-06-09: qty 2

## 2022-06-09 MED ORDER — KETOROLAC TROMETHAMINE 30 MG/ML IJ SOLN
INTRAMUSCULAR | Status: AC
  Filled 2022-06-09: qty 1

## 2022-06-09 MED ORDER — AMISULPRIDE (ANTIEMETIC) 5 MG/2ML IV SOLN: 10.0000 mg | Freq: Once | INTRAVENOUS | Status: DC | PRN

## 2022-06-09 MED ORDER — LIDOCAINE-EPINEPHRINE 1 %-1:100000 IJ SOLN
INTRAMUSCULAR | Status: DC | PRN
  Administered 2022-06-09: 4 mL

## 2022-06-09 MED ORDER — SODIUM CHLORIDE 0.9 % IR SOLN
Status: DC | PRN
  Administered 2022-06-09: 1000 mL

## 2022-06-09 MED ORDER — CHLORHEXIDINE GLUCONATE 0.12 % MT SOLN
OROMUCOSAL | Status: AC
  Filled 2022-06-09: qty 15

## 2022-06-09 MED ORDER — DEXMEDETOMIDINE HCL IN NACL 80 MCG/20ML IV SOLN
INTRAVENOUS | Status: DC | PRN
  Administered 2022-06-09: 8 ug via BUCCAL
  Administered 2022-06-09: 4 ug via BUCCAL

## 2022-06-09 MED ORDER — ONDANSETRON HCL 4 MG/2ML IJ SOLN
INTRAMUSCULAR | Status: DC | PRN
  Administered 2022-06-09: 4 mg via INTRAVENOUS

## 2022-06-09 MED ORDER — KETOROLAC TROMETHAMINE 15 MG/ML IJ SOLN: 15.0000 mg | Freq: Once | INTRAMUSCULAR | Status: DC | PRN

## 2022-06-09 MED ORDER — LACTATED RINGERS IV SOLN: INTRAVENOUS | Status: DC

## 2022-06-09 MED ORDER — ONDANSETRON HCL 4 MG/2ML IJ SOLN: 4.0000 mg | Freq: Once | INTRAMUSCULAR | Status: DC | PRN

## 2022-06-09 MED ORDER — FLUORESCEIN SODIUM 1 MG OP STRP
ORAL_STRIP | OPHTHALMIC | Status: AC
  Filled 2022-06-09: qty 2

## 2022-06-09 SURGICAL SUPPLY — 58 items
ANTIFOG SOL W/FOAM PAD STRL (MISCELLANEOUS) ×2
ATTRACTOMAT 16X20 MAGNETIC DRP (DRAPES) IMPLANT
BAG COUNTER SPONGE SURGICOUNT (BAG) ×2 IMPLANT
BAG SPNG CNTER NS LX DISP (BAG) ×1
BLADE NAVIG QUADCUT 4.3X13 M4 (BLADE) IMPLANT
BLADE RAD60 ROTATE M4 4 5PK (BLADE) IMPLANT
BLADE ROTATE RAD 40 4 M4 (BLADE) IMPLANT
BLADE ROTATE TRICUT 4X13 M4 (BLADE) ×2 IMPLANT
BLADE SURG 15 STRL LF DISP TIS (BLADE) IMPLANT
BLADE SURG 15 STRL SS (BLADE)
BUR TAPER CHOANAL ATRESIA 30K (BURR) IMPLANT
CANISTER SUCT 3000ML PPV (MISCELLANEOUS) ×4 IMPLANT
COAGULATOR SUCT 8FR VV (MISCELLANEOUS) IMPLANT
DRAPE HALF SHEET 40X57 (DRAPES) IMPLANT
DRESSING NASAL KENNEDY 3.5X.9 (MISCELLANEOUS) IMPLANT
DRSG NASAL KENNEDY 3.5X.9 (MISCELLANEOUS)
DRSG NASOPORE 8CM (GAUZE/BANDAGES/DRESSINGS) IMPLANT
ELECT COATED BLADE 2.86 ST (ELECTRODE) IMPLANT
ELECT NDL BLADE 2-5/6 (NEEDLE) IMPLANT
ELECT NEEDLE BLADE 2-5/6 (NEEDLE) ×1 IMPLANT
ELECT REM PT RETURN 9FT ADLT (ELECTROSURGICAL) ×1
ELECTRODE REM PT RTRN 9FT ADLT (ELECTROSURGICAL) ×2 IMPLANT
FILTER ARTHROSCOPY CONVERTOR (FILTER) ×4 IMPLANT
GLOVE BIO SURGEON STRL SZ7.5 (GLOVE) ×2 IMPLANT
GLOVE BIOGEL PI IND STRL 8 (GLOVE) ×2 IMPLANT
GOWN STRL REUS W/ TWL LRG LVL3 (GOWN DISPOSABLE) ×2 IMPLANT
GOWN STRL REUS W/ TWL XL LVL3 (GOWN DISPOSABLE) ×2 IMPLANT
GOWN STRL REUS W/TWL LRG LVL3 (GOWN DISPOSABLE) ×1
GOWN STRL REUS W/TWL XL LVL3 (GOWN DISPOSABLE) ×1
KIT BASIN OR (CUSTOM PROCEDURE TRAY) ×2 IMPLANT
KIT TURNOVER KIT B (KITS) ×2 IMPLANT
NDL PRECISIONGLIDE 27X1.5 (NEEDLE) ×2 IMPLANT
NDL SPNL 25GX3.5 QUINCKE BL (NEEDLE) ×2 IMPLANT
NEEDLE PRECISIONGLIDE 27X1.5 (NEEDLE) ×1 IMPLANT
NEEDLE SPNL 25GX3.5 QUINCKE BL (NEEDLE) ×1 IMPLANT
NS IRRIG 1000ML POUR BTL (IV SOLUTION) ×2 IMPLANT
PAD ARMBOARD 7.5X6 YLW CONV (MISCELLANEOUS) ×4 IMPLANT
PATTIES SURGICAL .5 X3 (DISPOSABLE) ×2 IMPLANT
PENCIL SMOKE EVACUATOR (MISCELLANEOUS) IMPLANT
SHEATH ENDOSCRUB 0 DEG (SHEATH) ×2 IMPLANT
SHEATH ENDOSCRUB 30 DEG (SHEATH) IMPLANT
SOLUTION ANTFG W/FOAM PAD STRL (MISCELLANEOUS) ×4 IMPLANT
SPECIMEN JAR SMALL (MISCELLANEOUS) IMPLANT
SPLINT NASAL POSISEP X .6X2 (GAUZE/BANDAGES/DRESSINGS) IMPLANT
SPLINT NASAL POSISEP X2 .8X2.3 (GAUZE/BANDAGES/DRESSINGS) IMPLANT
SUT ETHILON 3 0 FSL (SUTURE) IMPLANT
SUT PLAIN 4 0 ~~LOC~~ 1 (SUTURE) IMPLANT
SWAB COLLECTION DEVICE MRSA (MISCELLANEOUS) IMPLANT
SWAB CULTURE ESWAB REG 1ML (MISCELLANEOUS) IMPLANT
SYR 30ML LL (SYRINGE) ×2 IMPLANT
SYR 3ML LL SCALE MARK (SYRINGE) ×2 IMPLANT
SYR CONTROL 10ML LL (SYRINGE) IMPLANT
TOWEL GREEN STERILE FF (TOWEL DISPOSABLE) ×2 IMPLANT
TRACKER ENT INSTRUMENT (MISCELLANEOUS) ×2 IMPLANT
TRACKER ENT PATIENT (MISCELLANEOUS) ×2 IMPLANT
TRAY ENT MC OR (CUSTOM PROCEDURE TRAY) ×2 IMPLANT
TUBE CONNECTING 12X1/4 (SUCTIONS) ×2 IMPLANT
TUBING STRAIGHTSHOT EPS 5PK (TUBING) ×2 IMPLANT

## 2022-06-09 NOTE — Discharge Instructions (Signed)
Endoscopic Sinus Surgery: Postoperative Care Instructions  Your active participation in the postoperative phase of treatment is critical to the success of your surgery. Please read and follow the instructions below.  Bleeding: It is normal to experience some nasal bleeding the afternoon/evening of surgery. If bleeding occurs, lean forward slightly and gently pinch the bottom 2/3 of your nose between your thumb and forefinger. Hold this for approximately 10 minutes. If you are still experiencing bleeding, you may apply Afrin (other options include neo-synephrine, 4 way nasal spray, etc) to your nose to shrink the blood vessels. Spray 3 puffs to the affected nostril(s) and then gently pinch the nose for 10 minutes. If this does not relieve the bleeding or if the bleeding is more significant, please call the hospital operator and ask for the ENT doctor on call. If extensive bleeding occurs, please call 911 or be seen at your closest emergency department. Medications:  Pain medication: The only mediation you will need to take the night of surgery is pain medication. If you wish a non-narcotic alternative, extra-strength Tylenol and ibuprofen are often sufficient.  Antibiotics/oral steroids: If you are prescribed either antibiotic or oral steroids, start these the day after surgery.   Nasal steroid spray: Start (or restart) your nasal steroid spray one week after surgery.   Aspirin: DO NOT take aspirin for at least three days after the surgery. Saline:  Moisture is critical to proper healing. You will need to moisturize and rinse your nose for at least 6 weeks after surgery. Nasal Mist: The day after surgery, start spraying your nose with saline mist spray. Apply 2-3 sprays to each nostril every one to two hours throughout the day. Common brands include: Ayr, Ocean, or Simply Saline. They are available over-the-counter at most pharmacies.  Nasal Irrigations: The day after surgery, begin sinus rinses twice a  day using the "NeilMed Sinus Rinse Kit"  (available for purchase at many pharmacies). Use gentle pressure when irrigating. If you have any discomfort with irrigation, reduce the amount of pressure you are applying or discontinue irrigations until the first post-operative visit.  Post-operative care: Do not blow your nose for the first week after surgery. You may sniff back. If you need to sneeze, do not suppress it but instead sneeze with your mouth open. After 1 week, you may blow your nose gently. No strenuous activity for one week after surgery. No straining or lifting more than 15 lbs.  You should not bend over at the waist to pick things up. Instead bend at the knees, with your head up. Light walking and normal household activities are acceptable immediately after surgery. You may resume exercise at 50% intensity after one week, and full intensity at two weeks. You may drive the day after surgery if you are not requiring narcotic pain medication. If you are taking antibiotics and experience stomach upset, active culture yogurt (Nancy's yogurt) or lactobacillus tablets (available at health food stores) on a daily basis may help.  If you develop diarrhea, stop your antibiotics and contact our office.  Persistent diarrhea may require further medical evaluation. You may eat a regular diet. You should plan on taking one week off from work and ideally have a half-day planned for your first day back. Do not fly without your doctor's clearance for 7 days after surgery. Post-operative visits: You will have frequent return visits to our office after surgery.  At each visit, a procedure called nasal endoscopy will be performed. The sinus cavities may be cleaned (  termed "debridement") in order to assure appropriate healing of the sinuses. Visits typically occur 1, 3, and 6 weeks after surgery. We recommend taking a dose of pain medication about 45 minutes to one hour before your first postoperative visit (as  long as someone can accompany you to your visit).  Post-operative visits are usually scheduled at the time of surgery scheduling, however if you have any questions about your post-operative visit you can contact our clinic at [phone]. Call our office if you experience any of the following: Fever higher than 101?F Clear, watery nasal drainage Any visual changes or marked swelling of the eyes Severe headache or neck stiffness Severe diarrhea Brisk bleeding  If you need to speak to a doctor after hours, please call the [facility] at [phone] and ask for the ENT doctor on call, or call 911 for emergencies.  

## 2022-06-09 NOTE — Op Note (Signed)
OPERATIVE NOTE  Adrian Castillo Date/Time of Admission: 06/09/2022 11:54 AM  CSN: [id number];XLK:[id number] Attending Provider: Scarlette Ar, MD Room/Bed: MCPO/NONE DOB: 08-26-1955 Age: 67 y.o.   Pre-Op Diagnosis: Benign neoplasm of middle ear, nasal cavity and accessory sinuses  Post-Op Diagnosis: Benign neoplasm of middle ear, nasal cavity and accessory sinuses  Procedure:  Left maxillary antrostomy with tissue removal (36644) Left anterior ethmoidectomy (03474) Left inferior turbinate reduction (25956) Stereotactic CT guided navigation (extradural) - 38756  Anesthesia: General  Surgeon(s): Mervin Kung, MD  Staff: Circulator: Ivin Booty, RN; Jeronimo Greaves, RN Scrub Person: Coralee North T Vendor Representative : Alene Mires Circulator Assistant: Truddie Coco, RN  Implants: * No implants in log *  Specimens: ID Type Source Tests Collected by Time Destination  1 : left sinus contents Tissue PATH Sinus Contents/Nasal Polyps SURGICAL PATHOLOGY Scarlette Ar, MD 06/09/2022 1357   2 : left maxillary mucus Tissue PATH Other SURGICAL PATHOLOGY Scarlette Ar, MD 06/09/2022 1417     Complications: none  EBL: 30 ML  IVF: Per anesthesia ML  Condition: stable  Operative Findings:  Expansile mass of left medial maxillary sinus and inferior turbinate gross total excision with central portion of inferior turbinate   Indications for Procedure: Adrian Castillo is a 67 y.o. M with a history of polysubstance abuse (IV fentanyl/heroin), incidental finding of left sinonasal mass on CT imaging during ED trauma workup who presents today for definitive surgical management.   Informed consent: Informed consent was obtained. Risks discussed in detail including pain, bleeding, infection, injury to the eye with associated vision changes, vision loss, injury to the brain with associated CSF leak or meningitis, failure to improve or worsening of sense of smell, nasal  synechiae, nasal septal perforation, need for further procedures, risks of general anesthesia. Despite these risks the patient requested to proceed with surgery.  Details of the Procedure:  The patient was identified in the pre-op area and brought back to the operating room and laid in the supine position. General anesthesia was induced and the patient was endotracheally intubated without complication. A surgical pause was then performed to identify the correct patient, procedure, and location. After all were in agreement, we proceeded.  The patient was prepped and draped in the standard clean fashion for endoscopic sinus surgery. The nose was decongested with 1:1000 topical epinephrine.  We began by setting up the computer aided CT stereotactic navigation. The reference array was placed about the patients forehead and calibrated to known anatomic landmarks. It was used throughout the procedure as indicated given the chronic disease process and need for dissection along the skull base and orbit.   All cottonoids were removed and set aside. The 0, 30 degree endoscopes were attached to the video monitor for endoscopic viewing as needed throughout the procedure, and our attention was directed to the left nasal cavity. Approximately 2cc of 1% with 1:100k were injected into the sphenopalatine artery location. The middle turbinate was then deflected medially by a freer as a cottonoid soaked with 1:1000 epinephrine was inserted into the middle meatus. After this had time to take effect, the backbiter was then used to take down the uncinate, and the ball tipped probe was used to enter the maxillary sinus. The maxillary sinus ostium was widened anteriorly using the back-biter, and the edges were trimmed using thru-cut forceps and microdebrider. The 30 degree endoscope was used to confirm that the natural os was included in the antrostomy. The maxillary sinus was then widened to its anatomic  limits (posterior  maxillary wall, floor of the orbit, nasolacrimal duct, and concha of the inferior turbinate.   The J curette was then used to enter the ethmoid bulla at an anterior inferior position and the anterior ethmoid tissue taken down with a combination of a currette and the microdebrider. The basal lamella was identified and entered thereby identifying the superior turbinate. All sinus disease was cleared out and the ant/post ethmoids and maxillary sinus irrigated with a copious amount of NS.   Next endoscopic resection of the left maxillary sinus/middle turbinate mass was performed. The bovie was used to make anterior and posterior cuts along the inferior turbinate with assistance of CT navigation to make the anterior cut posterior to the nasolacrimal duct. Curved endoscopic scissors were used to cut through the conchal bone to the medial maxillary sinus wall. The central portion of the inferior turbinate was resected with the specimen. Suction bovie was used to cauterize the posterior stump. True cutting forceps were used to make the releasing cut along the floor of the maxilla to the posterior maxillary sinus wall. The medial maxillectomy specimen was then delivered and sent for permanent path. Yellow mucous was evacuated from the specimen grossly consistent with an mucocele. The entire mucocele was debrided from the maxillary sinus creating a mega-antrostomy.   All cottonoids were removed and hemostasis confirmed. The nasopharynx and oropharynx were suctioned free of blood and saline. Nasopore splints were placed in the left nasal cavity. The patient was then returned to the anesthetist, who awakened and extubated the patient without incident. The patient tolerated the procedure well without complications and was transferred to the recovery room in satisfactory condition.   Mervin Kung, MD [facility]  06/09/2022

## 2022-06-09 NOTE — H&P (Signed)
Adrian Castillo is an 67 y.o. male.    Chief Complaint:  Left sinonasal tumor   HPI: Patient presents today for planned elective procedure.  He/she denies any interval change in history since office visit on 03/29/2022.   Is currently actively using street fentanyl and occasionally cocaine. Denies use of cocaine the last week. Last drug use was "a small amount of fentanyl last night to prevent withdrawal".    Past Medical History:  Diagnosis Date   Diabetes mellitus without complication    type 2   Hep C w/o coma, chronic 06/2016   treated at [facility]   Hypertension    Substance abuse    IV [place] Addiction and cocaine    Past Surgical History:  Procedure Laterality Date   COLONOSCOPY     EYE SURGERY Left 04/2022   lens implant @ [facility]    No family history on file.  Social History:  reports that he has been smoking cigarettes. He has been smoking an average of .5 packs per day. He has never used smokeless tobacco. He reports current drug use. Drugs: IV, Cocaine, and Heroin. He reports that he does not drink alcohol.  Allergies:  Allergies  Allergen Reactions   Crab [Shellfish Allergy] Hives    Soft crab (receives TOO MUCH iodine) eats hard-shell crap with no issues   Tylenol [Acetaminophen] Hives    No medications prior to admission.    No results found for this or any previous visit (from the past 48 hour(s)). No results found.  ROS: negative other than stated in HPI  There were no vitals taken for this visit.  PHYSICAL EXAM: General: Resting comfortably in NAD  Lungs: Non-labored respiratinos  Studies Reviewed:  CT Maxillofacial 01/08/22   IMPRESSION: 1. No acute intracranial abnormality. 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 Negative for facial fracture. Mild soft tissue swelling left eyelid. 2. 11 x 22 mm lesion in the left nasal cavity projecting into the left maxillary sinus. This has enlarged since the prior study. This could be a polyp or  mucocele. Recommend ENT evaluation.     Electronically Signed   By: Marlan Palau M.D.   On: 01/18/2022 18:46       Assessment/Plan Left sinonasal tumor Polysubstance abuse / IV Drug use Hepatitis C  Proceed with LEFT FESS / endoscopic maxillectomy for excision of maxillary sinus tumor.  Informed consent was obtained. Risks discussed in detail including pain, bleeding, infection, injury to the eye with associated vision changes, vision loss, injury to the brain with associated CSF leak or meningitis, failure to improve or worsening of sense of smell, nasal synechiae, nasal septal perforation, injury to nasolacrimal duct with associated epiphora, need for further procedures, risks of general anesthesia. Patient explicitly accepts added risk of general anesthesia given IV Drug use and lack of sobriety.      Electronically signed by:  Scarlette Ar, MD  Staff Physician Facial Plastic & Reconstructive Surgery Otolaryngology - Head and Neck Surgery [facility]  06/09/2022, 8:21 AM

## 2022-06-09 NOTE — Anesthesia Procedure Notes (Signed)
Procedure Name: Intubation Date/Time: 06/09/2022 1:30 PM  Performed by: De Nurse, CRNAPre-anesthesia Checklist: Patient identified, Emergency Drugs available, Suction available and Patient being monitored Patient Re-evaluated:Patient Re-evaluated prior to induction Oxygen Delivery Method: Circle System Utilized Preoxygenation: Pre-oxygenation with 100% oxygen Induction Type: IV induction Ventilation: Mask ventilation without difficulty Laryngoscope Size: Mac and 4 Grade View: Grade I Tube type: Oral Tube size: 7.5 mm Number of attempts: 1 Airway Equipment and Method: Stylet and Oral airway Placement Confirmation: ETT inserted through vocal cords under direct vision, positive ETCO2 and breath sounds checked- equal and bilateral Secured at: 22 cm Tube secured with: Tape Dental Injury: Teeth and Oropharynx as per pre-operative assessment

## 2022-06-09 NOTE — Transfer of Care (Signed)
Immediate Anesthesia Transfer of Care Note  Patient: Adrian Castillo  Procedure(s) Performed: FUNCTIONAL ENDOSCOPIC SINUS SURGERY; MODIFIED MAXILLECTOMY AND ANTERIOR ETHMOIDECTOMY WITH FUSION (Left)  Patient Location: PACU  Anesthesia Type:General  Level of Consciousness: awake, alert , and oriented  Airway & Oxygen Therapy: Patient Spontanous Breathing  Post-op Assessment: Report given to RN  Post vital signs: Reviewed and stable  Last Vitals:  Vitals Value Taken Time  BP 141/108 06/09/22 1450  Temp    Pulse 70 06/09/22 1452  Resp 15 06/09/22 1452  SpO2 98 % 06/09/22 1452  Vitals shown include unvalidated device data.  Last Pain:  Vitals:   06/09/22 1221  TempSrc:   PainSc: 0-No pain         Complications: No notable events documented.

## 2022-06-10 ENCOUNTER — Encounter (HOSPITAL_COMMUNITY): Payer: Self-pay | Admitting: Otolaryngology

## 2022-06-10 LAB — SURGICAL PATHOLOGY

## 2022-06-10 NOTE — Anesthesia Postprocedure Evaluation (Signed)
Anesthesia Post Note  Patient: Levern Pitter  Procedure(s) Performed: FUNCTIONAL ENDOSCOPIC SINUS SURGERY; MODIFIED MAXILLECTOMY AND ANTERIOR ETHMOIDECTOMY WITH FUSION (Left)     Patient location during evaluation: PACU Anesthesia Type: General Level of consciousness: awake Pain management: pain level controlled Vital Signs Assessment: post-procedure vital signs reviewed and stable Respiratory status: spontaneous breathing, nonlabored ventilation and respiratory function stable Cardiovascular status: blood pressure returned to baseline and stable Postop Assessment: no apparent nausea or vomiting Anesthetic complications: no   No notable events documented.  Last Vitals:  Vitals:   06/09/22 1545 06/09/22 1600  BP: (!) 167/74 (!) 152/82  Pulse: (!) 55 (!) 59  Resp: 14 10  Temp:  36.7 C  SpO2: 96% 98%    Last Pain:  Vitals:   06/09/22 1545  TempSrc:   PainSc: 6    Pain Goal: Patients Stated Pain Goal: 3 (06/09/22 1450)                 Berry Godsey P Diannie Willner

## 2022-07-20 ENCOUNTER — Other Ambulatory Visit: Payer: Self-pay | Admitting: Nurse Practitioner

## 2022-07-20 DIAGNOSIS — F17219 Nicotine dependence, cigarettes, with unspecified nicotine-induced disorders: Secondary | ICD-10-CM

## 2022-07-20 DIAGNOSIS — N5082 Scrotal pain: Secondary | ICD-10-CM

## 2022-07-27 ENCOUNTER — Ambulatory Visit
Admission: RE | Admit: 2022-07-27 | Discharge: 2022-07-27 | Disposition: A | Discharge status: Home / Self Care | Payer: Medicare PPO | Source: Ambulatory Visit | Attending: Nurse Practitioner | Admitting: Nurse Practitioner

## 2022-07-27 DIAGNOSIS — N5082 Scrotal pain: Secondary | ICD-10-CM

## 2022-08-12 ENCOUNTER — Other Ambulatory Visit: Payer: Medicare HMO

## 2022-08-22 ENCOUNTER — Other Ambulatory Visit: Payer: Self-pay

## 2022-08-22 ENCOUNTER — Ambulatory Visit
Admission: RE | Admit: 2022-08-22 | Discharge: 2022-08-22 | Disposition: A | Discharge status: Home / Self Care | Payer: Medicare PPO | Source: Ambulatory Visit | Attending: Nurse Practitioner | Admitting: Nurse Practitioner

## 2022-08-22 DIAGNOSIS — F17219 Nicotine dependence, cigarettes, with unspecified nicotine-induced disorders: Secondary | ICD-10-CM

## 2023-02-28 ENCOUNTER — Emergency Department (HOSPITAL_COMMUNITY): Payer: Medicare Other

## 2023-02-28 ENCOUNTER — Encounter (HOSPITAL_COMMUNITY): Payer: Self-pay

## 2023-02-28 ENCOUNTER — Emergency Department (HOSPITAL_COMMUNITY)
Admission: EM | Admit: 2023-02-28 | Discharge: 2023-02-28 | Disposition: A | Discharge status: Home / Self Care | Payer: Medicare Other | Attending: Emergency Medicine | Admitting: Emergency Medicine

## 2023-02-28 DIAGNOSIS — Z79899 Other long term (current) drug therapy: Secondary | ICD-10-CM | POA: Insufficient documentation

## 2023-02-28 DIAGNOSIS — I10 Essential (primary) hypertension: Secondary | ICD-10-CM | POA: Insufficient documentation

## 2023-02-28 DIAGNOSIS — G510 Bell's palsy: Secondary | ICD-10-CM | POA: Insufficient documentation

## 2023-02-28 DIAGNOSIS — R791 Abnormal coagulation profile: Secondary | ICD-10-CM | POA: Diagnosis not present

## 2023-02-28 DIAGNOSIS — R29810 Facial weakness: Secondary | ICD-10-CM | POA: Diagnosis present

## 2023-02-28 LAB — DIFFERENTIAL
Abs Immature Granulocytes: 0.01 10*3/uL (ref 0.00–0.07)
Basophils Absolute: 0 10*3/uL (ref 0.0–0.1)
Basophils Relative: 0 %
Eosinophils Absolute: 0.1 10*3/uL (ref 0.0–0.5)
Eosinophils Relative: 2 %
Immature Granulocytes: 0 %
Lymphocytes Relative: 43 %
Lymphs Abs: 2.1 10*3/uL (ref 0.7–4.0)
Monocytes Absolute: 0.4 10*3/uL (ref 0.1–1.0)
Monocytes Relative: 9 %
Neutro Abs: 2.1 10*3/uL (ref 1.7–7.7)
Neutrophils Relative %: 46 %

## 2023-02-28 LAB — I-STAT CHEM 8, ED
BUN: 26 mg/dL — ABNORMAL HIGH (ref 8–23)
Calcium, Ion: 1.23 mmol/L (ref 1.15–1.40)
Chloride: 102 mmol/L (ref 98–111)
Creatinine, Ser: 1.7 mg/dL — ABNORMAL HIGH (ref 0.61–1.24)
Glucose, Bld: 114 mg/dL — ABNORMAL HIGH (ref 70–99)
HCT: 41 % (ref 39.0–52.0)
Hemoglobin: 13.9 g/dL (ref 13.0–17.0)
Potassium: 4.3 mmol/L (ref 3.5–5.1)
Sodium: 139 mmol/L (ref 135–145)
TCO2: 26 mmol/L (ref 22–32)

## 2023-02-28 LAB — COMPREHENSIVE METABOLIC PANEL
ALT: 16 U/L (ref 0–44)
AST: 19 U/L (ref 15–41)
Albumin: 3.9 g/dL (ref 3.5–5.0)
Alkaline Phosphatase: 71 U/L (ref 38–126)
Anion gap: 5 (ref 5–15)
BUN: 28 mg/dL — ABNORMAL HIGH (ref 8–23)
CO2: 27 mmol/L (ref 22–32)
Calcium: 9 mg/dL (ref 8.9–10.3)
Chloride: 104 mmol/L (ref 98–111)
Creatinine, Ser: 1.61 mg/dL — ABNORMAL HIGH (ref 0.61–1.24)
GFR, Estimated: 47 mL/min — ABNORMAL LOW (ref >60)
Glucose, Bld: 134 mg/dL — ABNORMAL HIGH (ref 70–99)
Potassium: 4.3 mmol/L (ref 3.5–5.1)
Sodium: 136 mmol/L (ref 135–145)
Total Bilirubin: 0.4 mg/dL (ref 0.0–1.2)
Total Protein: 7.9 g/dL (ref 6.5–8.1)

## 2023-02-28 LAB — URINALYSIS, ROUTINE W REFLEX MICROSCOPIC
Bilirubin Urine: NEGATIVE
Glucose, UA: >=500 mg/dL — AB
Hgb urine dipstick: NEGATIVE
Ketones, ur: NEGATIVE mg/dL
Nitrite: NEGATIVE
Protein, ur: NEGATIVE mg/dL
Specific Gravity, Urine: 1.016 (ref 1.005–1.030)
pH: 5 (ref 5.0–8.0)

## 2023-02-28 LAB — CBC
HCT: 41.5 % (ref 39.0–52.0)
Hemoglobin: 13 g/dL (ref 13.0–17.0)
MCH: 25.5 pg — ABNORMAL LOW (ref 26.0–34.0)
MCHC: 31.3 g/dL (ref 30.0–36.0)
MCV: 81.5 fL (ref 80.0–100.0)
Platelets: 146 10*3/uL — ABNORMAL LOW (ref 150–400)
RBC: 5.09 MIL/uL (ref 4.22–5.81)
RDW: 15.2 % (ref 11.5–15.5)
WBC: 4.8 10*3/uL (ref 4.0–10.5)
nRBC: 0 % (ref 0.0–0.2)

## 2023-02-28 LAB — RAPID URINE DRUG SCREEN, HOSP PERFORMED
Amphetamines: NOT DETECTED
Barbiturates: NOT DETECTED
Benzodiazepines: NOT DETECTED
Cocaine: POSITIVE — AB
Opiates: POSITIVE — AB
Tetrahydrocannabinol: POSITIVE — AB

## 2023-02-28 LAB — PROTIME-INR
INR: 1 (ref 0.8–1.2)
Prothrombin Time: 13.7 s (ref 11.4–15.2)

## 2023-02-28 LAB — ETHANOL: Alcohol, Ethyl (B): <10 mg/dL (ref <10)

## 2023-02-28 LAB — APTT: aPTT: 28 s (ref 24–36)

## 2023-02-28 MED ORDER — PREDNISONE 20 MG PO TABS
60.0000 mg | ORAL_TABLET | Freq: Once | ORAL | Status: AC
  Administered 2023-02-28: 60 mg via ORAL
  Filled 2023-02-28: qty 3

## 2023-02-28 MED ORDER — VALACYCLOVIR HCL 500 MG PO TABS
1000.0000 mg | ORAL_TABLET | Freq: Once | ORAL | Status: AC
  Administered 2023-02-28: 1000 mg via ORAL
  Filled 2023-02-28: qty 2

## 2023-02-28 MED ORDER — IOHEXOL 350 MG/ML SOLN: 60.0000 mL | Freq: Once | INTRAVENOUS | Status: DC | PRN

## 2023-02-28 MED ORDER — PREDNISONE 20 MG PO TABS: ORAL_TABLET | ORAL | 0 refills | Status: AC

## 2023-02-28 MED ORDER — GADOBUTROL 1 MMOL/ML IV SOLN
8.0000 mL | Freq: Once | INTRAVENOUS | Status: AC | PRN
  Administered 2023-02-28: 8 mL via INTRAVENOUS

## 2023-02-28 MED ORDER — VALACYCLOVIR HCL 1 G PO TABS: 1000.0000 mg | ORAL_TABLET | Freq: Three times a day (TID) | ORAL | 0 refills | Status: AC

## 2023-02-28 NOTE — ED Provider Notes (Signed)
 [place] EMERGENCY DEPARTMENT AT [facility] Provider Note   CSN: [id number] Arrival date & time: 02/28/23  1633     History  Chief Complaint  Patient presents with   Facial Droop    Adrian Castillo is a 68 y.o. male history of hypertension, fentanyl  abuse here presenting with facial droop.  Patient states that yesterday he noticed he is dropping things with his left arm.  He states that he woke up around 6 AM.  He states that his girlfriend noticed that he has left facial droop.  He looked in the mirror and noticed that his face is droopy.  He had some dizziness as well.  Patient states that he uses fentanyl  chronically.  Denies any history of stroke in the past.  The history is provided by the patient.       Home Medications Prior to Admission medications   Medication Sig Start Date End Date Taking? Authorizing Provider  albuterol  (VENTOLIN  HFA) 108 (90 Base) MCG/ACT inhaler Inhale 2 puffs into the lungs every 4 (four) hours as needed for wheezing or shortness of breath. 12/15/20   Lorren Greig PARAS, NP  amLODipine  (NORVASC ) 10 MG tablet Take 1 tablet (10 mg total) by mouth daily. 12/15/20 06/09/22  Lorren Greig PARAS, NP  atorvastatin  (LIPITOR) 20 MG tablet Take 20 mg by mouth daily. 08/04/21   [provider]  Brinzolamide-Brimonidine (SIMBRINZA) 1-0.2 % SUSP Place 1 drop into the left eye 2 (two) times daily.    [provider]  cholecalciferol (VITAMIN D3) 25 MCG (1000 UNIT) tablet Take 1,000 Units by mouth daily.    [provider]  FARXIGA 5 MG TABS tablet Take 5 mg by mouth daily. 08/05/21   [provider]  FENTANYL  CITRATE NA Place 1 Dose into the nose daily as needed (withdrawals).    [provider]  ketorolac  (ACULAR ) 0.5 % ophthalmic solution Place 1 drop into the left eye 4 (four) times daily. 04/18/22   [provider]  losartan  (COZAAR ) 50 MG tablet Take 1 tablet (50 mg total) by mouth daily. 01/11/22   Amin,  Ankit C, MD  moxifloxacin (VIGAMOX) 0.5 % ophthalmic solution Place 1 drop into the left eye 4 (four) times daily. 04/18/22   [provider]  naloxone  (NARCAN ) nasal spray 4 mg/0.1 mL Use one spray in the nostril in the case of an overdose 03/31/20   Couture, Cortni S, PA-C  prednisoLONE acetate (PRED FORTE) 1 % ophthalmic suspension Place 1 drop into the left eye 4 (four) times daily. 04/18/22   [provider]  sodium chloride  (OCEAN) 0.65 % SOLN nasal spray Place 2 sprays into the nose in the morning, at noon, in the evening, and at bedtime. 06/09/22 07/09/22  Luciano Standing, MD      Allergies    Georgianne cerise allergy] and Tylenol  [acetaminophen ]    Review of Systems   Review of Systems  Neurological:  Positive for dizziness and weakness.  All other systems reviewed and are negative.   Physical Exam Updated Vital Signs BP (!) 163/85   Pulse (!) 53   Temp 97.7 F (36.5 C) (Oral)   Resp 18   SpO2 99%  Physical Exam Vitals and nursing note reviewed.  Constitutional:      Appearance: Normal appearance.  HENT:     Head: Normocephalic.     Nose: Nose normal.  Eyes:     Extraocular Movements: Extraocular movements intact.     Pupils: Pupils are equal,  round, and reactive to light.  Cardiovascular:     Rate and Rhythm: Normal rate and regular rhythm.     Pulses: Normal pulses.     Heart sounds: Normal heart sounds.  Pulmonary:     Effort: Pulmonary effort is normal.     Breath sounds: Normal breath sounds.  Abdominal:     Palpations: Abdomen is soft.  Musculoskeletal:        General: Normal range of motion.     Cervical back: Normal range of motion and neck supple.  Skin:    General: Skin is warm.  Neurological:     Mental Status: He is alert.     Comments: Obvious left facial droop.  Patient able to raise eyebrows bilaterally.  Patient has 4 out of 5 strength left upper extremity and 5 out of 5 strength right upper extremity and bilateral lower  extremities.  Psychiatric:        Mood and Affect: Mood normal.        Behavior: Behavior normal.     ED Results / Procedures / Treatments   Labs (all labs ordered are listed, but only abnormal results are displayed) Labs Reviewed  CBC - Abnormal; Notable for the following components:      Result Value   MCH 25.5 (*)    Platelets 146 (*)    All other components within normal limits  COMPREHENSIVE METABOLIC PANEL - Abnormal; Notable for the following components:   Glucose, Bld 134 (*)    BUN 28 (*)    Creatinine, Ser 1.61 (*)    GFR, Estimated 47 (*)    All other components within normal limits  RAPID URINE DRUG SCREEN, HOSP PERFORMED - Abnormal; Notable for the following components:   Opiates POSITIVE (*)    Cocaine POSITIVE (*)    Tetrahydrocannabinol POSITIVE (*)    All other components within normal limits  URINALYSIS, ROUTINE W REFLEX MICROSCOPIC - Abnormal; Notable for the following components:   Glucose, UA >=500 (*)    Leukocytes,Ua TRACE (*)    Bacteria, UA RARE (*)    All other components within normal limits  I-STAT CHEM 8, ED - Abnormal; Notable for the following components:   BUN 26 (*)    Creatinine, Ser 1.70 (*)    Glucose, Bld 114 (*)    All other components within normal limits  ETHANOL  PROTIME-INR  APTT  DIFFERENTIAL    EKG None  Radiology MR BRAIN W WO CONTRAST Result Date: 02/28/2023 CLINICAL DATA:  Altered mental status, concern for brain abscess or stroke, off balance, facial droop EXAM: MRI HEAD WITHOUT AND WITH CONTRAST TECHNIQUE: Multiplanar, multiecho pulse sequences of the brain and surrounding structures were obtained without and with intravenous contrast. CONTRAST:  8mL GADAVIST  GADOBUTROL  1 MMOL/ML IV SOLN COMPARISON:  No prior MRI available; correlation is made with 02/28/2023 CT head FINDINGS: Brain: No restricted diffusion to suggest acute or subacute infarct. No abnormal parenchymal or meningeal enhancement. No acute hemorrhage, mass,  mass effect, or midline shift. No hydrocephalus or extra-axial collection. Pituitary and craniocervical junction within normal limits. No hemosiderin deposition to suggest remote hemorrhage. Normal cerebral volume for age. Scattered and confluent T2 hyperintense signal in the periventricular white matter, likely the sequela of moderate chronic small vessel ischemic disease. Vascular: Normal arterial flow voids. Normal arterial and venous enhancement. Skull and upper cervical spine: Normal marrow signal. Sinuses/Orbits: Mucosal thickening in the maxillary sinuses. Status post left lens replacement. Other: The mastoid air cells are  well aerated. IMPRESSION: No acute intracranial process. No evidence of acute or subacute infarct. Electronically Signed   By: Donald Campion M.D.   On: 02/28/2023 19:18   CT HEAD WO CONTRAST Result Date: 02/28/2023 CLINICAL DATA:  right facial droop EXAM: CT HEAD WITHOUT CONTRAST TECHNIQUE: Contiguous axial images were obtained from the base of the skull through the vertex without intravenous contrast. RADIATION DOSE REDUCTION: This exam was performed according to the departmental dose-optimization program which includes automated exposure control, adjustment of the mA and/or kV according to patient size and/or use of iterative reconstruction technique. COMPARISON:  CT head January 18, 2022. FINDINGS: Brain: No evidence of acute large vascular territory infarction, hemorrhage, hydrocephalus, extra-axial collection or mass lesion/mass effect. Patchy white matter hypodensities are nonspecific but compatible with chronic microvascular ischemic disease. Small remote right cerebellar infarct. Vascular: No hyperdense vessel identified. Skull: No acute fracture. Sinuses/Orbits: Partially imaged right greater than left maxillary sinus mucosal thickening. Remaining sinuses are clear. No acute orbital findings. Other: No mastoid effusions. IMPRESSION: Stable head CT.  No evidence of acute  intracranial abnormality. Electronically Signed   By: Gilmore GORMAN Molt M.D.   On: 02/28/2023 17:20    Procedures Procedures    Angiocath insertion Performed by: Alm VEAR Cave  Consent: Verbal consent obtained. Risks and benefits: risks, benefits and alternatives were discussed Time out: Immediately prior to procedure a time out was called to verify the correct patient, procedure, equipment, support staff and site/side marked as required.  Preparation: Patient was prepped and draped in the usual sterile fashion.  Vein Location: R antecube   Ultrasound Guided  Gauge: 20 long   Normal blood return and flush without difficulty Patient tolerance: Patient tolerated the procedure well with no immediate complications.    Medications Ordered in ED Medications  iohexol  (OMNIPAQUE ) 350 MG/ML injection 60 mL (has no administration in time range)  predniSONE  (DELTASONE ) tablet 60 mg (has no administration in time range)  valACYclovir  (VALTREX ) tablet 1,000 mg (has no administration in time range)  gadobutrol  (GADAVIST ) 1 MMOL/ML injection 8 mL (8 mLs Intravenous Contrast Given 02/28/23 1903)    ED Course/ Medical Decision Making/ A&P                                 Medical Decision Making Raistlin Gum is a 68 y.o. male here with left arm weakness and left facial droop.  While his facial droop is consistent with possible Bell's palsy but the subjective left arm weakness makes me concerned that he may have a stroke.  He also uses fentanyl  also consider brain abscesses as well.  He is outside the window for tPA or IR tPA.  Plan to get CBC and CMP and MRI brain  7:57 PM I reviewed patient's labs and independently interpreted MRI.  Labs unremarkable.  UA showed cocaine and opiates.  MRI did not show any stroke or abscess.  At this point I think he likely has Bell's palsy.  Patient stable for discharge  Problems Addressed: Bell's palsy: acute illness or injury  Amount and/or Complexity of  Data Reviewed Labs: ordered. Decision-making details documented in ED Course. Radiology: ordered and independent interpretation performed. Decision-making details documented in ED Course.  Risk Prescription drug management.    Final Clinical Impression(s) / ED Diagnoses Final diagnoses:  Bell's palsy    Rx / DC Orders ED Discharge Orders     None  Patt Alm Macho, MD 02/28/23 2001

## 2023-02-28 NOTE — Discharge Instructions (Addendum)
 Take prednisone  as prescribed   Take valtrex  three times daily for a week   You likely have Bell's palsy.  Your MRI did not show a stroke currently  If your left eye gets dry please use artificial tears and you may need an eye shield at night  Return to ER if you have worse weakness or numbness or trouble speaking

## 2023-02-28 NOTE — ED Notes (Signed)
 Pt resting with visitor at bedside. Bed at its lowest position, brakes locked, and call bell remains in reach.

## 2023-02-28 NOTE — ED Triage Notes (Signed)
 Pt presents with c/o left side facial droop since yesterday morning. Pt reports that he felt very off balance yesterday and yesterday morning is when the drooping started. No other symptoms noted at this time.

## 2023-02-28 NOTE — ED Provider Triage Note (Signed)
 Emergency Medicine Provider Triage Evaluation Note  Adrian Castillo , a 68 y.o. male  was evaluated in triage.  Pt complains of right facial droop and weakness that began yesterday when he woke up.  Patient states he woke up and his wife noticed that his right lip was drooping.  Patient denies history of blood clots or strokes.  Patient has chest pain or shortness of breath or vision changes.  Patient is tried to stand up and fell and hit the left side of his head.  Patient has LOC.  Patient is not on any blood thinners.  Patient states that he does drink methanol daily along with fentanyl  use last used today.  Review of Systems  Positive:  Negative:   Physical Exam  BP (!) 145/96 (BP Location: Right Arm)   Pulse 72   Temp 97.7 F (36.5 C) (Oral)   Resp 16   SpO2 100%  Gen:   Awake, no distress   Resp:  Normal effort  MSK:   Moves extremities without difficulty  Other:  Right-sided facial droop with slurring of words, fatigable horizontal left sided nystagmus, able to ambulate without issue, normal finger-to-nose, forehead sparing  Medical Decision Making  Medically screening exam initiated at 4:57 PM.  Appropriate orders placed.  Adrian Castillo was informed that the remainder of the evaluation will be completed by another provider, this initial triage assessment does not replace that evaluation, and the importance of remaining in the ED until their evaluation is complete.  Workup initiated, patient is outside of tPA window but does have obvious facial droop on exam with forehead sparing, nurse notified that patient will need to be next in stroke labs and imaging ordered, patient stable at this time.   Adrian Lynwood DASEN, PA-C 02/28/23 1659

## 2023-02-28 NOTE — ED Notes (Signed)
 Patient transported to MRI

## 2023-08-29 ENCOUNTER — Other Ambulatory Visit: Payer: Self-pay

## 2023-08-29 ENCOUNTER — Emergency Department (HOSPITAL_COMMUNITY)

## 2023-08-29 ENCOUNTER — Emergency Department (HOSPITAL_COMMUNITY)
Admission: EM | Admit: 2023-08-29 | Discharge: 2023-08-29 | Disposition: A | Discharge status: Home / Self Care | Attending: Emergency Medicine | Admitting: Emergency Medicine

## 2023-08-29 DIAGNOSIS — R5383 Other fatigue: Secondary | ICD-10-CM | POA: Diagnosis present

## 2023-08-29 DIAGNOSIS — Z79899 Other long term (current) drug therapy: Secondary | ICD-10-CM | POA: Diagnosis not present

## 2023-08-29 DIAGNOSIS — R079 Chest pain, unspecified: Secondary | ICD-10-CM | POA: Diagnosis not present

## 2023-08-29 DIAGNOSIS — N183 Chronic kidney disease, stage 3 unspecified: Secondary | ICD-10-CM | POA: Diagnosis not present

## 2023-08-29 DIAGNOSIS — E1122 Type 2 diabetes mellitus with diabetic chronic kidney disease: Secondary | ICD-10-CM | POA: Insufficient documentation

## 2023-08-29 DIAGNOSIS — I129 Hypertensive chronic kidney disease with stage 1 through stage 4 chronic kidney disease, or unspecified chronic kidney disease: Secondary | ICD-10-CM | POA: Diagnosis not present

## 2023-08-29 LAB — BASIC METABOLIC PANEL WITH GFR
Anion gap: 9 (ref 5–15)
BUN: 16 mg/dL (ref 8–23)
CO2: 25 mmol/L (ref 22–32)
Calcium: 9.4 mg/dL (ref 8.9–10.3)
Chloride: 101 mmol/L (ref 98–111)
Creatinine, Ser: 1.11 mg/dL (ref 0.61–1.24)
GFR, Estimated: >60 mL/min (ref >60)
Glucose, Bld: 118 mg/dL — ABNORMAL HIGH (ref 70–99)
Potassium: 3.9 mmol/L (ref 3.5–5.1)
Sodium: 135 mmol/L (ref 135–145)

## 2023-08-29 LAB — TROPONIN I (HIGH SENSITIVITY)
Troponin I (High Sensitivity): 6 ng/L (ref <18)
Troponin I (High Sensitivity): 6 ng/L (ref <18)

## 2023-08-29 LAB — CBC
HCT: 46.1 % (ref 39.0–52.0)
Hemoglobin: 14.8 g/dL (ref 13.0–17.0)
MCH: 26.8 pg (ref 26.0–34.0)
MCHC: 32.1 g/dL (ref 30.0–36.0)
MCV: 83.5 fL (ref 80.0–100.0)
Platelets: 135 K/uL — ABNORMAL LOW (ref 150–400)
RBC: 5.52 MIL/uL (ref 4.22–5.81)
RDW: 14.4 % (ref 11.5–15.5)
WBC: 5.9 K/uL (ref 4.0–10.5)
nRBC: 0 % (ref 0.0–0.2)

## 2023-08-29 MED ORDER — IOHEXOL 350 MG/ML SOLN: 75.0000 mL | Freq: Once | INTRAVENOUS | Status: DC | PRN

## 2023-08-29 MED ORDER — FUROSEMIDE 20 MG PO TABS: 20.0000 mg | ORAL_TABLET | Freq: Every day | ORAL | 0 refills | Status: AC

## 2023-08-29 MED ORDER — FUROSEMIDE 20 MG PO TABS
20.0000 mg | ORAL_TABLET | Freq: Every day | ORAL | 0 refills | Status: DC
Start: 2023-08-29 — End: 2023-08-29
  Filled 2023-08-29: qty 3, 3d supply, fill #0

## 2023-08-29 MED ORDER — IOHEXOL 350 MG/ML SOLN
75.0000 mL | Freq: Once | INTRAVENOUS | Status: AC | PRN
  Administered 2023-08-29: 75 mL via INTRAVENOUS

## 2023-08-29 NOTE — ED Provider Notes (Signed)
 [place] [facility]PART[facility] AT [facility] Provider Note   CSN: [id number] Arrival date & time: 08/29/23  9280     Patient presents with: Chest Pain, mass on my heart, Fatigue, and Shortness of Breath   Adrian Castillo is a 68 y.o. male.   Pt came in regarding concerns about fatigue, mass in his heart, and for cardiac imaging. He was seen by his PCP for swelling of both his legs where he was diagnosed with Stage III kidney disease. His PCP also suspected pt to have CHF and ordered an echo. Pt said he got an echo of heart done which showed the presence of a mass. He was required to f/u with an MRI of chest at [facility] but was told they did not provide that service. Pt has had some pain between the right shoulder and chest region that started a few days ago but is not intense. He feels fatigued and has lost weight unintentionally. Pt weight an average of 204 lbs but has come down to 187 lbs in the last couple of months. He denies any abdominal pain, nausea, vomiting, HA, SOB, diarrhea, urinary symptoms, fever, chills. He has a Hx of using IV drugs and said he stopped months ago. Pt has a PMH of HTN, DM.    Chest Pain Associated symptoms: shortness of breath   Shortness of Breath Associated symptoms: chest pain        Prior to Admission medications   Medication Sig Start Date End Date Taking? Authorizing Provider  albuterol  (VENTOLIN  HFA) 108 (90 Base) MCG/ACT inhaler Inhale 2 puffs into the lungs every 4 (four) hours as needed for wheezing or shortness of breath. 12/15/20   Lorren Greig PARAS, NP  amLODipine  (NORVASC ) 10 MG tablet Take 1 tablet (10 mg total) by mouth daily. 12/15/20 06/09/22  Lorren Greig PARAS, NP  atorvastatin  (LIPITOR) 20 MG tablet Take 20 mg by mouth daily. 08/04/21   [provider]  Brinzolamide-Brimonidine (SIMBRINZA) 1-0.2 % SUSP Place 1 drop into the left eye 2 (two) times daily.    [provider]  cholecalciferol (VITAMIN D3) 25 MCG  (1000 UNIT) tablet Take 1,000 Units by mouth daily.    [provider]  FARXIGA 5 MG TABS tablet Take 5 mg by mouth daily. 08/05/21   [provider]  FENTANYL  CITRATE NA Place 1 Dose into the nose daily as needed (withdrawals).    [provider]  ketorolac  (ACULAR ) 0.5 % ophthalmic solution Place 1 drop into the left eye 4 (four) times daily. 04/18/22   [provider]  losartan  (COZAAR ) 50 MG tablet Take 1 tablet (50 mg total) by mouth daily. 01/11/22   Amin, Ankit C, MD  moxifloxacin (VIGAMOX) 0.5 % ophthalmic solution Place 1 drop into the left eye 4 (four) times daily. 04/18/22   [provider]  naloxone  (NARCAN ) nasal spray 4 mg/0.1 mL Use one spray in the nostril in the case of an overdose 03/31/20   Couture, Cortni S, PA-C  prednisoLONE acetate (PRED FORTE) 1 % ophthalmic suspension Place 1 drop into the left eye 4 (four) times daily. 04/18/22   [provider]  predniSONE  (DELTASONE ) 20 MG tablet Take 60 mg daily x 2 days then 40 mg daily x 2 days then 20 mg daily x 2 days 02/28/23   Patt Alm Macho, MD  sodium chloride  (OCEAN) 0.65 % SOLN nasal spray Place 2 sprays into the nose in the morning, at noon, in the evening, and  at bedtime. 06/09/22 07/09/22  Luciano Standing, MD  valACYclovir  (VALTREX ) 1000 MG tablet Take 1 tablet (1,000 mg total) by mouth 3 (three) times daily. 02/28/23   Patt Alm Macho, MD    Allergies: Georgianne cerise allergy] and Tylenol  Bary ]    Review of Systems  Constitutional:        All pertinent ROS in HPI  Respiratory:  Positive for shortness of breath.   Cardiovascular:  Positive for chest pain.    Updated Vital Signs BP 137/78 (BP Location: Left Arm)   Pulse 60   Temp (!) 97.5 F (36.4 C) (Oral)   Resp 18   SpO2 97%   Physical Exam Constitutional:      Appearance: He is well-developed. He is ill-appearing.     Comments: Pt was ill-appearing and looked fatigued  HENT:     Head:  Normocephalic.  Cardiovascular:     Rate and Rhythm: Normal rate and regular rhythm.  Pulmonary:     Effort: Pulmonary effort is normal.     Breath sounds: Normal breath sounds.  Chest:     Chest wall: No mass or tenderness.     Comments: Pt mentioned having pain in his left upper chest region Abdominal:     Palpations: Abdomen is soft.     Tenderness: There is no abdominal tenderness. There is no guarding or rebound.  Neurological:     Mental Status: He is alert and oriented to person, place, and time.     (all labs ordered are listed, but only abnormal results are displayed) Labs Reviewed  BASIC METABOLIC PANEL WITH GFR - Abnormal; Notable for the following components:      Result Value   Glucose, Bld 118 (*)    All other components within normal limits  CBC - Abnormal; Notable for the following components:   Platelets 135 (*)    All other components within normal limits  TROPONIN I (HIGH SENSITIVITY)  TROPONIN I (HIGH SENSITIVITY)    EKG: EKG Interpretation Date/Time:  Tuesday August 29 2023 07:39:51 EDT Ventricular Rate:  56 PR Interval:  174 QRS Duration:  92 QT Interval:  510 QTC Calculation: 492 R Axis:   -5  Text Interpretation: Sinus bradycardia Minimal voltage criteria for LVH, may be normal variant ( R in aVL ) Prolonged QT Abnormal ECG When compared with ECG of 28-Feb-2023 17:20, No significant change since last tracing Confirmed by Dreama Longs ([id number]) on 08/29/2023 9:40:41 AM  Radiology: [facility] Chest 2 View Result Date: 08/29/2023 CLINICAL DATA:  chest pain EXAM: CHEST - 2 VIEW COMPARISON:  August 22, 2022, October 03, 2017 FINDINGS: No focal airspace consolidation, pleural effusion, or pneumothorax. No cardiomegaly.No acute fracture or destructive lesion. Multilevel thoracic osteophytosis. IMPRESSION: No acute cardiopulmonary abnormality. Electronically Signed   By: Rogelia Myers M.D.   On: 08/29/2023 08:24     Procedures   Medications Ordered in the ED - No  data to display                                  Medical Decision Making This patient presents to the ED for concern of fatigue going on for a few months, this involves an extensive number of treatment options, and is a complaint that carries with it a high risk of complications and morbidity.  The differential diagnosis includes Cardiac tumor, fatigue, endocarditis, PE, CHF.    Co morbidities that complicate the patient evaluation:  IV drug use, HTN, DM  Lab Tests:  I Ordered, and personally interpreted labs.  The pertinent results include: lab results obtained were unremarkable.   Imaging Studies ordered:  I ordered imaging studies including CXR and CT angio I independently visualized and interpreted imaging which showed negative acute cardiopulmonary abnormality in the CXR and no evidence of PE in the Lung angio I agree with the radiologist interpretation  Consultations Obtained:  Problem List / ED Course:  Pt came in for fatigue and wanting to get MRI of chest as per his PCP. He had an initial temp of 97.5 when he came in and was Bradycardic. Pt was ill appearing and had mild pain in her upper right chest region. His PCP was on vacation and we asked her office to fax over his echo. We did not receive an echo from the PCP's office and decided to get a Chest angio for PE for work-up. His chest angio and CXR were both negative for any PE and acute cardiopulmonary abnormality.     Reevaluation:  After the interventions noted above, I reevaluated the patient and found that they have : pt was feeling better and said he had no chest pain and was ready to go home.   Dispostion:  After consideration of the diagnostic results and the patients response to treatment, I feel that the patent would benefit from being discharged. We felt the pt would benefit from getting lasix  for 3 days if there was fluid build-up in his thoracic region especially since he was on a suspicion for having CHF  by hs PCP. We put him on the prescribed dose of furosemide  for 3 days. He was asked to f/u with a cardiologist and his PCP for his mass.    Amount and/or Complexity of Data Reviewed Labs: ordered. Radiology: ordered.  Risk Prescription drug management.    Final diagnoses:  None    ED Discharge Orders     None          Edgardo Pontiff, DO 08/29/23 1539    Dreama Longs, MD 09/01/23 0004

## 2023-08-29 NOTE — ED Notes (Signed)
Pt verbalized understanding of discharge instructions. Pt ambulated from ed with steady gait.  

## 2023-08-29 NOTE — ED Triage Notes (Addendum)
 Pt. Stated, I went to my Dr. And I had chest pain and said I have a mass on my heart and I need a MRI , They already did a ECho cardigram.

## 2023-08-31 ENCOUNTER — Other Ambulatory Visit: Payer: Self-pay

## 2023-09-05 LAB — COLOGUARD: COLOGUARD: NEGATIVE

## 2023-09-07 ENCOUNTER — Other Ambulatory Visit: Payer: Self-pay

## 2023-09-13 ENCOUNTER — Ambulatory Visit: Attending: Cardiovascular Disease | Admitting: Cardiology

## 2023-09-13 DIAGNOSIS — R072 Precordial pain: Secondary | ICD-10-CM | POA: Insufficient documentation

## 2023-09-13 NOTE — Progress Notes (Deleted)
  Cardiology Office Note:  .   Date:  09/13/2023  ID:  Adrian Castillo, DOB November 10, 1955, MRN [id number] PCP: Adrian Rojelio Caldron, NP  [facility] Providers Cardiologist:  Adrian Lawrence, MD PCP: Adrian Rojelio Caldron, NP  No chief complaint on file.    Adrian Castillo is a 68 y.o. male with *** Discussed the use of AI scribe software for clinical note transcription with the patient, who gave verbal consent to proceed.  History of Present Illness       There were no vitals filed for this visit.    ROS      Studies Reviewed: .        *** Labs 08/2023: Chol ***, TG ***, HDL ***, LDL *** HbA1C ***% Hb 14.8 Cr 1.1 Trop HS 6  CTA chest 08/2023: 1. No evidence of pulmonary embolism. 2. Diffuse patchy ground-glass opacities with scattered subsegmental mucous plugging and diffuse peribronchial wall thickening, which may reflect a combination of mild pulmonary edema and infection/inflammation. 3. Severe narrowing of the lower right internal jugular vein immediately superior to the confluence with right subclavian vein, which may reflect sequela of prior trauma. 4. Enlarged main pulmonary artery trunk, which can be seen in the setting of pulmonary arterial hypertension. 5. Reported cardiac mass is not well seen by CT. 6. Aortic Atherosclerosis (ICD10-I70.0). Coronary artery calcifications. Assessment for potential risk factor modification, dietary therapy or pharmacologic therapy may be warranted, if clinically indicated.      Risk Assessment/Calculations:   {Does this patient have ATRIAL FIBRILLATION?:[id number]}    Physical Exam   VISIT DIAGNOSES: No diagnosis found.   Adrian Castillo is a 68 y.o. male with *** Assessment and Plan Assessment & Plan       {Are you ordering a CV Procedure (e.g. stress test, cath, DCCV, TEE, etc)?   Press F2        :[id number]}    No orders of the defined types were placed in this encounter.    F/u in  ***  Signed, Adrian JINNY Lawrence, MD

## 2023-11-11 ENCOUNTER — Emergency Department (HOSPITAL_COMMUNITY)

## 2023-11-11 ENCOUNTER — Other Ambulatory Visit: Payer: Self-pay

## 2023-11-11 ENCOUNTER — Encounter (HOSPITAL_COMMUNITY): Payer: Self-pay

## 2023-11-11 ENCOUNTER — Emergency Department (HOSPITAL_COMMUNITY)
Admission: EM | Admit: 2023-11-11 | Discharge: 2023-11-11 | Disposition: A | Discharge status: Home / Self Care | Attending: Emergency Medicine | Admitting: Emergency Medicine

## 2023-11-11 DIAGNOSIS — W208XXA Other cause of strike by thrown, projected or falling object, initial encounter: Secondary | ICD-10-CM | POA: Diagnosis not present

## 2023-11-11 DIAGNOSIS — Z79899 Other long term (current) drug therapy: Secondary | ICD-10-CM | POA: Insufficient documentation

## 2023-11-11 DIAGNOSIS — S91109A Unspecified open wound of unspecified toe(s) without damage to nail, initial encounter: Secondary | ICD-10-CM

## 2023-11-11 DIAGNOSIS — S91202A Unspecified open wound of left great toe with damage to nail, initial encounter: Secondary | ICD-10-CM | POA: Diagnosis not present

## 2023-11-11 DIAGNOSIS — E119 Type 2 diabetes mellitus without complications: Secondary | ICD-10-CM | POA: Insufficient documentation

## 2023-11-11 DIAGNOSIS — S91209A Unspecified open wound of unspecified toe(s) with damage to nail, initial encounter: Secondary | ICD-10-CM

## 2023-11-11 DIAGNOSIS — S91205A Unspecified open wound of left lesser toe(s) with damage to nail, initial encounter: Secondary | ICD-10-CM | POA: Insufficient documentation

## 2023-11-11 DIAGNOSIS — Y990 Civilian activity done for income or pay: Secondary | ICD-10-CM | POA: Diagnosis not present

## 2023-11-11 DIAGNOSIS — I10 Essential (primary) hypertension: Secondary | ICD-10-CM | POA: Diagnosis not present

## 2023-11-11 DIAGNOSIS — M79672 Pain in left foot: Secondary | ICD-10-CM | POA: Diagnosis present

## 2023-11-11 MED ORDER — KETOROLAC TROMETHAMINE 30 MG/ML IJ SOLN
30.0000 mg | Freq: Once | INTRAMUSCULAR | Status: AC
  Administered 2023-11-11: 30 mg via INTRAMUSCULAR
  Filled 2023-11-11: qty 1

## 2023-11-11 MED ORDER — LIDOCAINE HCL (PF) 1 % IJ SOLN
10.0000 mL | Freq: Once | INTRAMUSCULAR | Status: AC
  Administered 2023-11-11: 10 mL
  Filled 2023-11-11: qty 10

## 2023-11-11 NOTE — Discharge Instructions (Addendum)
 Leave the bandage in place until Monday.  Then you can remove the bandage wash the area gently with soap and water and apply new bandages.  You need to take it easy over the next week and follow-up with the podiatrist.  Call them Monday and have them schedule in a appointment for this coming week.  Elevate the foot when you get home and apply ice if you can tolerate it.  Also take 2 Tylenol  and 2 ibuprofen  together every 6 hours as needed for pain in addition to your methadone.

## 2023-11-11 NOTE — ED Provider Notes (Signed)
 [place] EMERGENCY DEPARTMENT AT [facility] Provider Note   CSN: [id number] Arrival date & time: 11/11/23  1445     Patient presents with: Foot Injury   Adrian Castillo is a 68 y.o. male.   Patient is a 68 year old male with a history of hepatitis C, hypertension, diabetes, chronic pain on methadone daily who is presenting today after an accident at work.  He reports a pallet was being moved that had trusses on it and probably close to 6 or 700 pounds and it shifted landing on his left foot.  He reports that the nail and skin of his first 3 toes got pushed off.  He is having significant pain in his left foot.  Denies any injury anywhere else.  He was not wearing his steel toed boots today.  Reports his last tetanus shot was in the last 5 months.  No other complaints at this time.  The history is provided by the patient.  Foot Injury      Prior to Admission medications   Medication Sig Start Date End Date Taking? Authorizing Provider  albuterol  (VENTOLIN  HFA) 108 (90 Base) MCG/ACT inhaler Inhale 2 puffs into the lungs every 4 (four) hours as needed for wheezing or shortness of breath. 12/15/20   Lorren Greig PARAS, NP  amLODipine  (NORVASC ) 10 MG tablet Take 1 tablet (10 mg total) by mouth daily. 12/15/20 06/09/22  Lorren Greig PARAS, NP  atorvastatin  (LIPITOR) 20 MG tablet Take 20 mg by mouth daily. 08/04/21   [provider]  Brinzolamide-Brimonidine (SIMBRINZA) 1-0.2 % SUSP Place 1 drop into the left eye 2 (two) times daily.    [provider]  cholecalciferol (VITAMIN D3) 25 MCG (1000 UNIT) tablet Take 1,000 Units by mouth daily.    [provider]  FARXIGA 5 MG TABS tablet Take 5 mg by mouth daily. 08/05/21   [provider]  FENTANYL  CITRATE NA Place 1 Dose into the nose daily as needed (withdrawals).    [provider]  furosemide  (LASIX ) 20 MG tablet Take 1 tablet (20 mg total) by mouth daily for 3 days. 08/29/23 09/01/23  Edgardo Pontiff,  DO  ketorolac  (ACULAR ) 0.5 % ophthalmic solution Place 1 drop into the left eye 4 (four) times daily. 04/18/22   [provider]  losartan  (COZAAR ) 50 MG tablet Take 1 tablet (50 mg total) by mouth daily. 01/11/22   Amin, Ankit C, MD  moxifloxacin (VIGAMOX) 0.5 % ophthalmic solution Place 1 drop into the left eye 4 (four) times daily. 04/18/22   [provider]  naloxone  (NARCAN ) nasal spray 4 mg/0.1 mL Use one spray in the nostril in the case of an overdose 03/31/20   Couture, Cortni S, PA-C  prednisoLONE acetate (PRED FORTE) 1 % ophthalmic suspension Place 1 drop into the left eye 4 (four) times daily. 04/18/22   [provider]  predniSONE  (DELTASONE ) 20 MG tablet Take 60 mg daily x 2 days then 40 mg daily x 2 days then 20 mg daily x 2 days 02/28/23   Patt Alm Macho, MD  sodium chloride  (OCEAN) 0.65 % SOLN nasal spray Place 2 sprays into the nose in the morning, at noon, in the evening, and at bedtime. 06/09/22 07/09/22  Luciano Standing, MD  valACYclovir  (VALTREX ) 1000 MG tablet Take 1 tablet (1,000 mg total) by mouth 3 (three) times daily. 02/28/23   Patt Alm Macho, MD    Allergies: Georgianne cerise allergy] and Tylenol  Fredrica ]    Review of Systems  Updated Vital Signs BP (!) 135/91 (BP Location: Right Arm)   Pulse 70   Temp (!) 97 F (36.1 C) (Temporal)   Resp 20   Ht 5' 11 (1.803 m)   Wt 86.2 kg   SpO2 100%   BMI 26.50 kg/m   Physical Exam Vitals and nursing note reviewed.  HENT:     Head: Normocephalic.  Cardiovascular:     Rate and Rhythm: Normal rate.     Pulses: Normal pulses.  Pulmonary:     Effort: Pulmonary effort is normal.  Musculoskeletal:       Feet:     Comments: The midfoot and ankle without any pain or swelling  Skin:    General: Skin is dry.  Neurological:     Mental Status: He is alert. Mental status is at baseline.     (all labs ordered are listed, but only abnormal results are displayed) Labs Reviewed - No data to  display  EKG: None  Radiology: DG Ankle Complete Left Result Date: 11/11/2023 EXAM: 3 or more VIEW(S) XRAY OF THE LEFT ANKLE 11/11/2023 03:20:00 PM CLINICAL HISTORY: Injury. Per triage notes: Pt BIB [facility] coming from work. Pt had an approx 700-800 lb pallet fall onto his L foot causing hyperextension of his toes. EMS report there is a possible degloving, but it was bandaged before they got there. COMPARISON: None available. FINDINGS: BONES AND JOINTS: No acute fracture. No focal osseous lesion. No joint dislocation. SOFT TISSUES: The soft tissues are unremarkable. IMPRESSION: 1. No acute osseous abnormality. Electronically signed by: Waddell Calk MD 11/11/2023 03:46 PM EDT RP Workstation: [id number]   DG Foot Complete Left Result Date: 11/11/2023 EXAM: 3 or more VIEW(S) XRAY OF THE LEFT FOOT 11/11/2023 03:20:00 PM COMPARISON: None available. CLINICAL HISTORY: Injury. Reason for exam: injury; Per triage notes: Pt BIB [facility] coming from work. Pt had an approx 700-800 lb pallet fall onto his L foot causing hyperextension of his toes. EMS report there is a possible degloving, but it was bandaged before they got there. FINDINGS: BONES AND JOINTS: Acute nondisplaced fractures of third and forth toes distal phalanx. Equivocal non-displaced fractures involving the first and second toe distal phalanges, limited due to patient positioning. Lateral subluxation of the third PIP joint. SOFT TISSUES: Soft tissue swelling of the great toe. IMPRESSION: 1. Acute non-displaced fractures of the third and fourth distal phalanges. 2. Equivocal nondisplaced fractures of the first and second distal phalanges, limited by positioning. 3. Lateral subluxation of the third PIP joint. 4. Soft tissue swelling of the great toe. Electronically signed by: Waddell Calk MD 11/11/2023 03:45 PM EDT RP Workstation: [id number]     Procedures   Medications Ordered in the ED  ketorolac  (TORADOL ) 30 MG/ML injection 30 mg (has no  administration in time range)  lidocaine  (PF) (XYLOCAINE ) 1 % injection 10 mL (10 mLs Infiltration Given 11/11/23 1559)                                    Medical Decision Making Amount and/or Complexity of Data Reviewed Radiology: ordered and independent interpretation performed. Decision-making details documented in ED Course.  Risk Prescription drug management.   Patient presenting today with degloving of the toes when a large pallet fell on his foot.  He has nails 1 through 3 on the left foot avulsed with the layer of epidermis of the 1st and 2nd toe with degloving.  No underlying  lacerations.  Sensation is all intact.  Also noted swelling to the toes.  No other signs of injury.  Wounds were cleaned, nails were not able to be replaced, no lacerations were present and no repair was needed.  The third nail was put back in place.  The other nails were thickened with fungal infections and not salvageable.  Xeroform was wrapped over the open wounds and bandages were applied.  Patient was placed in a postop shoe.  I have independently visualized and interpreted pt's images today.  X-ray of the ankle was normal, x-ray of the toe with soft tissue swelling noted.  Radiology reports an acute nondisplaced fracture of the 3rd and 4th distal phalange and possible nondisplaced fractures of the 1st and 2nd toes.  Patient will need follow-up with podiatry.  He will take Tylenol  and ibuprofen  in addition to his methadone every 6 hours.      Final diagnoses:  Degloving injury of multiple toes  Avulsion of toenail, initial encounter    ED Discharge Orders     None          Doretha Folks, MD 11/11/23 1714

## 2023-11-11 NOTE — ED Triage Notes (Signed)
 Pt BIB [facility] coming from work. Pt had an approx 700-800 lb pallet fall onto his L foot causing hyperextension of his toes. EMS report there is a possible degloving, but it was bandaged before they got there.   EMS Vitals 152/94 HR 76 SpO2 98% on R/A

## 2024-01-04 ENCOUNTER — Ambulatory Visit (INDEPENDENT_AMBULATORY_CARE_PROVIDER_SITE_OTHER): Admitting: Podiatry

## 2024-01-04 ENCOUNTER — Ambulatory Visit (INDEPENDENT_AMBULATORY_CARE_PROVIDER_SITE_OTHER)

## 2024-01-04 DIAGNOSIS — M79675 Pain in left toe(s): Secondary | ICD-10-CM

## 2024-01-04 DIAGNOSIS — E119 Type 2 diabetes mellitus without complications: Secondary | ICD-10-CM

## 2024-01-04 DIAGNOSIS — B351 Tinea unguium: Secondary | ICD-10-CM

## 2024-01-04 DIAGNOSIS — M79674 Pain in right toe(s): Secondary | ICD-10-CM

## 2024-01-04 DIAGNOSIS — S86011A Strain of right Achilles tendon, initial encounter: Secondary | ICD-10-CM | POA: Diagnosis not present
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
## 2024-02-01 ENCOUNTER — Ambulatory Visit: Admitting: Podiatry
